# Patient Record
Sex: Female | Born: 1975 | Race: Black or African American | Hispanic: No | Marital: Married | Smoking: Never smoker
Health system: Southern US, Community
[De-identification: ages and names within clinical notes are randomized; demographics above are authoritative.]

## PROBLEM LIST (undated history)

## (undated) DIAGNOSIS — R030 Elevated blood-pressure reading, without diagnosis of hypertension: Secondary | ICD-10-CM

## (undated) DIAGNOSIS — J45909 Unspecified asthma, uncomplicated: Secondary | ICD-10-CM

## (undated) DIAGNOSIS — B373 Candidiasis of vulva and vagina: Secondary | ICD-10-CM

## (undated) DIAGNOSIS — D649 Anemia, unspecified: Secondary | ICD-10-CM

## (undated) DIAGNOSIS — R87629 Unspecified abnormal cytological findings in specimens from vagina: Secondary | ICD-10-CM

## (undated) DIAGNOSIS — Z8759 Personal history of other complications of pregnancy, childbirth and the puerperium: Secondary | ICD-10-CM

## (undated) DIAGNOSIS — Z8632 Personal history of gestational diabetes: Secondary | ICD-10-CM

## (undated) DIAGNOSIS — Z8719 Personal history of other diseases of the digestive system: Secondary | ICD-10-CM

## (undated) DIAGNOSIS — N6310 Unspecified lump in the right breast, unspecified quadrant: Secondary | ICD-10-CM

## (undated) DIAGNOSIS — B9689 Other specified bacterial agents as the cause of diseases classified elsewhere: Secondary | ICD-10-CM

## (undated) DIAGNOSIS — Z8742 Personal history of other diseases of the female genital tract: Secondary | ICD-10-CM

## (undated) DIAGNOSIS — T7840XA Allergy, unspecified, initial encounter: Secondary | ICD-10-CM

## (undated) DIAGNOSIS — O24419 Gestational diabetes mellitus in pregnancy, unspecified control: Secondary | ICD-10-CM

## (undated) DIAGNOSIS — IMO0002 Reserved for concepts with insufficient information to code with codable children: Secondary | ICD-10-CM

## (undated) DIAGNOSIS — D573 Sickle-cell trait: Secondary | ICD-10-CM

## (undated) DIAGNOSIS — N83209 Unspecified ovarian cyst, unspecified side: Secondary | ICD-10-CM

## (undated) DIAGNOSIS — E119 Type 2 diabetes mellitus without complications: Secondary | ICD-10-CM

## (undated) DIAGNOSIS — N760 Acute vaginitis: Secondary | ICD-10-CM

## (undated) DIAGNOSIS — D219 Benign neoplasm of connective and other soft tissue, unspecified: Secondary | ICD-10-CM

## (undated) DIAGNOSIS — B999 Unspecified infectious disease: Secondary | ICD-10-CM

## (undated) DIAGNOSIS — O209 Hemorrhage in early pregnancy, unspecified: Secondary | ICD-10-CM

## (undated) DIAGNOSIS — Z9889 Other specified postprocedural states: Secondary | ICD-10-CM

## (undated) DIAGNOSIS — R112 Nausea with vomiting, unspecified: Secondary | ICD-10-CM

## (undated) DIAGNOSIS — I10 Essential (primary) hypertension: Secondary | ICD-10-CM

## (undated) HISTORY — DX: Personal history of gestational diabetes: Z86.32

## (undated) HISTORY — DX: Allergy, unspecified, initial encounter: T78.40XA

## (undated) HISTORY — DX: Unspecified asthma, uncomplicated: J45.909

## (undated) HISTORY — DX: Hemorrhage in early pregnancy, unspecified: O20.9

## (undated) HISTORY — DX: Other specified bacterial agents as the cause of diseases classified elsewhere: B96.89

## (undated) HISTORY — DX: Benign neoplasm of connective and other soft tissue, unspecified: D21.9

## (undated) HISTORY — PX: LAPAROTOMY: SHX154

## (undated) HISTORY — DX: Personal history of other complications of pregnancy, childbirth and the puerperium: Z87.59

## (undated) HISTORY — DX: Acute vaginitis: N76.0

## (undated) HISTORY — PX: LEEP: SHX91

## (undated) HISTORY — DX: Personal history of other diseases of the digestive system: Z87.19

## (undated) HISTORY — DX: Sickle-cell trait: D57.3

## (undated) HISTORY — DX: Personal history of other diseases of the female genital tract: Z87.42

## (undated) HISTORY — DX: Reserved for concepts with insufficient information to code with codable children: IMO0002

## (undated) HISTORY — DX: Unspecified lump in the right breast, unspecified quadrant: N63.10

## (undated) HISTORY — DX: Elevated blood-pressure reading, without diagnosis of hypertension: R03.0

## (undated) HISTORY — PX: WISDOM TOOTH EXTRACTION: SHX21

## (undated) HISTORY — DX: Candidiasis of vulva and vagina: B37.3

---

## 1997-07-06 DIAGNOSIS — R87619 Unspecified abnormal cytological findings in specimens from cervix uteri: Secondary | ICD-10-CM

## 1997-07-06 DIAGNOSIS — IMO0002 Reserved for concepts with insufficient information to code with codable children: Secondary | ICD-10-CM

## 1997-07-06 HISTORY — DX: Unspecified abnormal cytological findings in specimens from cervix uteri: R87.619

## 1997-07-06 HISTORY — DX: Reserved for concepts with insufficient information to code with codable children: IMO0002

## 1997-11-05 ENCOUNTER — Other Ambulatory Visit: Admission: RE | Admit: 1997-11-05 | Discharge: 1997-11-05 | Payer: Self-pay | Admitting: Gynecology

## 1999-09-18 ENCOUNTER — Other Ambulatory Visit: Admission: RE | Admit: 1999-09-18 | Discharge: 1999-09-18 | Payer: Self-pay | Admitting: Gynecology

## 1999-12-07 ENCOUNTER — Inpatient Hospital Stay (HOSPITAL_COMMUNITY): Admission: AD | Admit: 1999-12-07 | Discharge: 1999-12-08 | Payer: Self-pay | Admitting: Gynecology

## 1999-12-07 ENCOUNTER — Encounter: Payer: Self-pay | Admitting: Gynecology

## 2000-01-23 ENCOUNTER — Ambulatory Visit (HOSPITAL_COMMUNITY): Admission: RE | Admit: 2000-01-23 | Discharge: 2000-01-23 | Payer: Self-pay | Admitting: Gynecology

## 2000-01-23 ENCOUNTER — Encounter: Payer: Self-pay | Admitting: Gynecology

## 2000-09-27 ENCOUNTER — Other Ambulatory Visit: Admission: RE | Admit: 2000-09-27 | Discharge: 2000-09-27 | Payer: Self-pay | Admitting: Gynecology

## 2001-07-06 DIAGNOSIS — E119 Type 2 diabetes mellitus without complications: Secondary | ICD-10-CM

## 2001-07-06 HISTORY — DX: Type 2 diabetes mellitus without complications: E11.9

## 2001-09-27 ENCOUNTER — Other Ambulatory Visit: Admission: RE | Admit: 2001-09-27 | Discharge: 2001-09-27 | Payer: Self-pay | Admitting: Gynecology

## 2002-03-24 ENCOUNTER — Encounter: Admission: RE | Admit: 2002-03-24 | Discharge: 2002-04-18 | Payer: Self-pay | Admitting: *Deleted

## 2002-05-30 ENCOUNTER — Inpatient Hospital Stay (HOSPITAL_COMMUNITY): Admission: AD | Admit: 2002-05-30 | Discharge: 2002-06-01 | Payer: Self-pay | Admitting: Gynecology

## 2002-07-11 ENCOUNTER — Other Ambulatory Visit: Admission: RE | Admit: 2002-07-11 | Discharge: 2002-07-11 | Payer: Self-pay | Admitting: Gynecology

## 2003-07-07 DIAGNOSIS — B373 Candidiasis of vulva and vagina: Secondary | ICD-10-CM

## 2003-07-07 DIAGNOSIS — B3731 Acute candidiasis of vulva and vagina: Secondary | ICD-10-CM

## 2003-07-07 HISTORY — DX: Acute candidiasis of vulva and vagina: B37.31

## 2003-07-07 HISTORY — DX: Candidiasis of vulva and vagina: B37.3

## 2003-07-12 ENCOUNTER — Other Ambulatory Visit: Admission: RE | Admit: 2003-07-12 | Discharge: 2003-07-12 | Payer: Self-pay | Admitting: Obstetrics and Gynecology

## 2004-07-06 DIAGNOSIS — O209 Hemorrhage in early pregnancy, unspecified: Secondary | ICD-10-CM

## 2004-07-06 DIAGNOSIS — B9689 Other specified bacterial agents as the cause of diseases classified elsewhere: Secondary | ICD-10-CM

## 2004-07-06 DIAGNOSIS — N6310 Unspecified lump in the right breast, unspecified quadrant: Secondary | ICD-10-CM

## 2004-07-06 HISTORY — DX: Unspecified lump in the right breast, unspecified quadrant: N63.10

## 2004-07-06 HISTORY — DX: Hemorrhage in early pregnancy, unspecified: O20.9

## 2004-07-06 HISTORY — DX: Other specified bacterial agents as the cause of diseases classified elsewhere: B96.89

## 2004-07-21 ENCOUNTER — Other Ambulatory Visit: Admission: RE | Admit: 2004-07-21 | Discharge: 2004-07-21 | Payer: Self-pay | Admitting: Obstetrics and Gynecology

## 2004-07-24 ENCOUNTER — Encounter: Admission: RE | Admit: 2004-07-24 | Discharge: 2004-07-24 | Payer: Self-pay | Admitting: Obstetrics and Gynecology

## 2004-09-09 ENCOUNTER — Inpatient Hospital Stay (HOSPITAL_COMMUNITY): Admission: AD | Admit: 2004-09-09 | Discharge: 2004-09-09 | Payer: Self-pay | Admitting: Obstetrics and Gynecology

## 2004-09-12 ENCOUNTER — Inpatient Hospital Stay (HOSPITAL_COMMUNITY): Admission: AD | Admit: 2004-09-12 | Discharge: 2004-09-12 | Payer: Self-pay | Admitting: Obstetrics and Gynecology

## 2004-09-15 ENCOUNTER — Inpatient Hospital Stay (HOSPITAL_COMMUNITY): Admission: AD | Admit: 2004-09-15 | Discharge: 2004-09-15 | Payer: Self-pay | Admitting: Obstetrics and Gynecology

## 2004-09-22 ENCOUNTER — Inpatient Hospital Stay (HOSPITAL_COMMUNITY): Admission: AD | Admit: 2004-09-22 | Discharge: 2004-09-22 | Payer: Self-pay | Admitting: Obstetrics and Gynecology

## 2004-09-24 ENCOUNTER — Inpatient Hospital Stay (HOSPITAL_COMMUNITY): Admission: AD | Admit: 2004-09-24 | Discharge: 2004-09-26 | Payer: Self-pay | Admitting: Obstetrics and Gynecology

## 2004-09-24 DIAGNOSIS — Z8759 Personal history of other complications of pregnancy, childbirth and the puerperium: Secondary | ICD-10-CM

## 2004-09-24 HISTORY — DX: Personal history of other complications of pregnancy, childbirth and the puerperium: Z87.59

## 2005-08-24 ENCOUNTER — Other Ambulatory Visit: Admission: RE | Admit: 2005-08-24 | Discharge: 2005-08-24 | Payer: Self-pay | Admitting: Obstetrics and Gynecology

## 2005-10-22 ENCOUNTER — Ambulatory Visit (HOSPITAL_COMMUNITY): Admission: RE | Admit: 2005-10-22 | Discharge: 2005-10-22 | Payer: Self-pay | Admitting: Obstetrics and Gynecology

## 2006-07-06 DIAGNOSIS — IMO0001 Reserved for inherently not codable concepts without codable children: Secondary | ICD-10-CM

## 2006-07-06 HISTORY — DX: Reserved for inherently not codable concepts without codable children: IMO0001

## 2007-07-07 DIAGNOSIS — Z8719 Personal history of other diseases of the digestive system: Secondary | ICD-10-CM

## 2007-07-07 HISTORY — DX: Personal history of other diseases of the digestive system: Z87.19

## 2011-03-26 DIAGNOSIS — Z8742 Personal history of other diseases of the female genital tract: Secondary | ICD-10-CM

## 2011-03-26 HISTORY — DX: Personal history of other diseases of the female genital tract: Z87.42

## 2011-04-15 DIAGNOSIS — D219 Benign neoplasm of connective and other soft tissue, unspecified: Secondary | ICD-10-CM

## 2011-04-15 HISTORY — DX: Benign neoplasm of connective and other soft tissue, unspecified: D21.9

## 2012-01-05 ENCOUNTER — Telehealth: Payer: Self-pay | Admitting: Obstetrics and Gynecology

## 2012-01-05 NOTE — Telephone Encounter (Signed)
Spoke with pt rgd msg pt c/o cramping with cycle no relief with advil advised pt can take ibuprofen for cramps no heavy bleeding pt only have to wear panty liner advised pt first available appt 01/08/12 pt will keep appt

## 2012-01-08 ENCOUNTER — Encounter: Payer: Self-pay | Admitting: Obstetrics and Gynecology

## 2012-06-16 ENCOUNTER — Encounter: Payer: Self-pay | Admitting: Obstetrics and Gynecology

## 2012-06-16 ENCOUNTER — Ambulatory Visit (INDEPENDENT_AMBULATORY_CARE_PROVIDER_SITE_OTHER): Payer: Managed Care, Other (non HMO) | Admitting: Obstetrics and Gynecology

## 2012-06-16 VITALS — BP 118/78 | Ht 61.0 in | Wt 182.0 lb

## 2012-06-16 DIAGNOSIS — D219 Benign neoplasm of connective and other soft tissue, unspecified: Secondary | ICD-10-CM

## 2012-06-16 DIAGNOSIS — D259 Leiomyoma of uterus, unspecified: Secondary | ICD-10-CM

## 2012-06-16 DIAGNOSIS — Z01419 Encounter for gynecological examination (general) (routine) without abnormal findings: Secondary | ICD-10-CM

## 2012-06-16 LAB — CBC
HCT: 33.2 % — ABNORMAL LOW (ref 36.0–46.0)
Hemoglobin: 10.9 g/dL — ABNORMAL LOW (ref 12.0–15.0)
RBC: 4.05 MIL/uL (ref 3.87–5.11)
WBC: 6.6 10*3/uL (ref 4.0–10.5)

## 2012-06-16 MED ORDER — IBUPROFEN 600 MG PO TABS: 600.0000 mg | ORAL_TABLET | Freq: Four times a day (QID) | ORAL | Status: DC | PRN

## 2012-06-16 NOTE — Progress Notes (Signed)
Last Pap: 03/2011 WNL: Yes Regular Periods:yes Contraception: none  Monthly Breast exam:no Tetanus<55yrs:yes Nl.Bladder Function:yes Daily BMs:no Healthy Diet:yes Calcium:yes Mammogram:no Exercise:yes Have often Exercise: 3-5 times a week  Seatbelt: yes Abuse at home: no Stressful work:yes PCP: Dr.Gibson  Change in PMH: unchanged  Change in ZOX:[id number]  Pt stated no issues today.  BP 118/78  Ht 5\' 1"  (1.549 m)  Wt 182 lb (82.555 kg)  BMI 34.39 kg/m2  LMP 05/31/2012  Pt with complaints:yes.  Pt has stared she has pain from fibroids and heavy bleeding.  She bleeds for 7-9 days and has throbbing or an ache.  She also has pain with IC.  No SOB or CP Physical Examination: General appearance - alert, well appearing, and in no distress Mental status - normal mood, behavior, speech, dress, motor activity, and thought processes Neck - supple, no significant adenopathy,  thyroid exam: thyroid is normal in size without nodules or tenderness Chest - clear to auscultation, no wheezes, rales or rhonchi, symmetric air entry Heart - normal rate and regular rhythm Abdomen - soft, nontender, nondistended, no masses or organomegaly Breasts - breasts appear normal, no suspicious masses, no skin or nipple changes or axillary nodes Pelvic - normal external genitalia, vulva, vagina, cervix,  and adnexa.  Uterus is irregular and about 14 weeks size Rectal - normal rectal, no masses, rectal exam not indicated Back exam - full range of motion, no tenderness, palpable spasm or pain on motion Neurological - alert, oriented, normal speech, no focal findings or movement disorder noted Musculoskeletal - no joint tenderness, deformity or swelling Extremities - no edema, redness or tenderness in the calves or thighs Skin - normal coloration and turgor, no rashes, no suspicious skin lesions noted Routine exam Pap sent yes Mammogram due no nothing used for contraception RT 4-6 weeks for US/poss EBX Her  embx and TSH and PRL was normal last yr. Will check a cbc today

## 2012-06-16 NOTE — Patient Instructions (Signed)
Fibroids Fibroids are lumps (tumors) that can occur any place in a woman's body. These lumps are not cancerous. Fibroids vary in size, weight, and where they grow. HOME CARE  Do not take aspirin.  Write down the number of pads or tampons you use during your period. Tell your doctor. This can help determine the best treatment for you. GET HELP RIGHT AWAY IF:  You have pain in your lower belly (abdomen) that is not helped with medicine.  You have cramps that are not helped with medicine.  You have more bleeding between or during your period.  You feel lightheaded or pass out (faint).  Your lower belly pain gets worse. MAKE SURE YOU:  Understand these instructions.  Will watch your condition.  Will get help right away if you are not doing well or get worse. Document Released: 07/25/2010 Document Revised: 09/14/2011 Document Reviewed: 07/25/2010 Exit[employer] Patient Information 2013 [employer].  

## 2012-06-16 NOTE — Addendum Note (Signed)
Addended by: Loralyn Freshwater on: 06/16/2012 11:18 AM   Modules accepted: Orders

## 2012-06-20 LAB — PAP IG W/ RFLX HPV ASCU

## 2012-06-22 ENCOUNTER — Telehealth: Payer: Self-pay

## 2012-06-22 NOTE — Telephone Encounter (Signed)
Lm on vm for pt to call back.

## 2012-06-24 ENCOUNTER — Telehealth: Payer: Self-pay

## 2012-06-24 NOTE — Telephone Encounter (Signed)
Lm on vm for pt to call back.

## 2012-08-01 ENCOUNTER — Encounter: Payer: Managed Care, Other (non HMO) | Admitting: Obstetrics and Gynecology

## 2012-08-01 ENCOUNTER — Other Ambulatory Visit: Payer: Managed Care, Other (non HMO)

## 2012-08-10 ENCOUNTER — Encounter: Payer: Self-pay | Admitting: Obstetrics and Gynecology

## 2012-08-10 ENCOUNTER — Other Ambulatory Visit: Payer: Managed Care, Other (non HMO)

## 2012-08-10 ENCOUNTER — Ambulatory Visit: Payer: Managed Care, Other (non HMO) | Admitting: Obstetrics and Gynecology

## 2012-08-10 ENCOUNTER — Ambulatory Visit: Payer: Managed Care, Other (non HMO)

## 2012-08-10 ENCOUNTER — Other Ambulatory Visit: Payer: Self-pay | Admitting: Obstetrics and Gynecology

## 2012-08-10 VITALS — BP 120/80 | Wt 182.0 lb

## 2012-08-10 DIAGNOSIS — D219 Benign neoplasm of connective and other soft tissue, unspecified: Secondary | ICD-10-CM

## 2012-08-10 NOTE — Progress Notes (Addendum)
F/u u/s BP 120/80  Wt 182 lb (82.555 kg)  LMP 07/23/2012 [place] sig for posterior fibroid.  It is 8 cm and has increased in size.   All treatments reviewed with the pt.  She desires robotic myomectomy.   Will have consult with DR RIVARD  CONSULTATION NOTE  15 minutes face-to-face encounter to review robotic myomectomy. Records reviewed. Single posterior fibroid 8.5 cm.  Exam: Fundus at umbilicus with 15 cm to xyphoid. Patient is a candidate for robotic approach.  The following was reviewed with patient:    Benign nature of fibroids as well as unpredictable growth during reproductive years.      Possible fibroid symptoms including: menorrhagia, dysmenorrhea, urinary frequency,  pelvic pain, and back pain.     Expected resolution/improvement of symptoms with menopausal state.    Treatment options: expectant management, NSAIDS, oral contraceptives,       Depo Provera, Uterine Artery Embolization, myomectomy, and hysterectomy.  Surgical approach options to myomectomy were reviewed including, laparoscopic, robotically assisted and abdominal. Risks, benefits and limitations discussed including but not limited to impossibility to complete surgery through robotic approach, not removing all fibroids due to absent tactile feedback, possibility of existing adhesions ( 1 cesarean section and 1 ectopic pregnancy), risk of cavity entry with requiring repeat cesarean birth and risk of post-op adhesions including tubal occlusion.   The patient will call back with her decision  Thank you for the consultation,  Silverio Lay MD

## 2012-08-11 ENCOUNTER — Telehealth: Payer: Self-pay | Admitting: Obstetrics and Gynecology

## 2012-08-15 ENCOUNTER — Other Ambulatory Visit: Payer: Self-pay | Admitting: Obstetrics and Gynecology

## 2012-08-15 ENCOUNTER — Telehealth: Payer: Self-pay | Admitting: Obstetrics and Gynecology

## 2012-08-17 ENCOUNTER — Telehealth: Payer: Self-pay | Admitting: Obstetrics and Gynecology

## 2012-08-17 NOTE — Telephone Encounter (Signed)
Robotic Myomectomy scheduled for 09/07/12@ 12:30 with SR/EP. [employer] Effective 07/06/12. Plan pays 80/20 after a $1,500 deductible. Pre-op due $364.20. -Adrianne Pridgen

## 2012-08-26 ENCOUNTER — Encounter (HOSPITAL_COMMUNITY): Payer: Self-pay | Admitting: Pharmacist

## 2012-08-29 ENCOUNTER — Encounter: Payer: Self-pay | Admitting: Obstetrics and Gynecology

## 2012-08-29 DIAGNOSIS — D219 Benign neoplasm of connective and other soft tissue, unspecified: Secondary | ICD-10-CM | POA: Insufficient documentation

## 2012-08-29 DIAGNOSIS — I10 Essential (primary) hypertension: Secondary | ICD-10-CM | POA: Insufficient documentation

## 2012-08-29 DIAGNOSIS — Z9079 Acquired absence of other genital organ(s): Secondary | ICD-10-CM | POA: Insufficient documentation

## 2012-08-29 DIAGNOSIS — J45909 Unspecified asthma, uncomplicated: Secondary | ICD-10-CM | POA: Insufficient documentation

## 2012-08-30 ENCOUNTER — Encounter: Payer: Self-pay | Admitting: Obstetrics and Gynecology

## 2012-08-30 ENCOUNTER — Ambulatory Visit: Payer: Managed Care, Other (non HMO) | Admitting: Obstetrics and Gynecology

## 2012-08-30 VITALS — BP 116/80 | HR 82 | Temp 98.2°F | Resp 14 | Ht 61.0 in | Wt 184.0 lb

## 2012-08-30 DIAGNOSIS — Z01818 Encounter for other preprocedural examination: Secondary | ICD-10-CM

## 2012-08-30 DIAGNOSIS — N920 Excessive and frequent menstruation with regular cycle: Secondary | ICD-10-CM

## 2012-08-30 DIAGNOSIS — N946 Dysmenorrhea, unspecified: Secondary | ICD-10-CM

## 2012-08-30 DIAGNOSIS — D219 Benign neoplasm of connective and other soft tissue, unspecified: Secondary | ICD-10-CM

## 2012-08-30 NOTE — Progress Notes (Signed)
Briana Ford is a 37 y.o. female G3P0011 who presents for a robot assisted myomectomy because of symptomatic fibroids, menorrhagia, dysmenorrhea and anemia.  For over four years the patient reports increasing volume and duration of menses.  Currently she will flow for 7 days with the need to change a tampon plus two super pads every two hours.  She may spot for a few days after her period stops as well as after intercourse.  She admits to a throbbing heaviness, as opposed to cramping,  that is relieved with Ibuprofen 800 mg along with positional dyspareunia and constipation.  She denies urinary tract symptoms, vaginitis symptoms, fatigue or dizziness.  In 2012 she had a normal TSH, prolactin and endometrial biopsy but in 06/2012 her H/H = 10.9/33.2 .  The patient is on iron supplementation.  A pelvic ultrasound 06/2012 showed a uterus-10.4 x 8.38 x 8.53 cm with a posterior, left intramural fibroid measuring 8.2 x 6.7 x 8.4 cm; right ovary-3.46 x 2.42 x 1.39 cm and left ovary-3.70 x 2.56 x 1.84 cm.  A review of both medical and surgical management options were reviewed with the patient and she has decided to proceed with a myomectomy.   Past Medical History  OB History: G3P0011 Ectopic 2009,  Fetal Demise due to PTL and C-section 2003  GYN History: menarche: 37 YO; LMP: 08/18/12;    Contracepton no method  The patient reports a past history of: herpes.  Remote history of abnormal PAP smear treated with LEEP in 1998 but they have been normal since;  Last PAP smear: 2013  Medical History: Hypertension, asthma, hemorrhoids and fibroids  Surgical History:  1998 Loop Electrosurgical Excision Procedure, 2006 Laparoscopic Right Salpingectomy for Ectopic, 2009 Uterine Polypectomy Denies history of blood transfusions but has severe nausea and vomiting with anesthesia.  Family History: Diabetes, cardiovascular disease, thyroid disease, depression and hypertension  Social History: Married and employed  [employer]; Denies alcohol, tobacco or illicit drug use   Outpatient Encounter Prescriptions as of 08/30/2012  Medication Sig Dispense Refill  . albuterol (PROVENTIL HFA;VENTOLIN HFA) 108 (90 BASE) MCG/ACT inhaler Inhale 2 puffs into the lungs every 6 (six) hours as needed for wheezing.      . budesonide-formoterol (SYMBICORT) 160-4.5 MCG/ACT inhaler Inhale 2 puffs into the lungs 2 (two) times daily.      Marland Kitchen desloratadine (CLARINEX) 5 MG tablet Take 5 mg by mouth daily.      Marland Kitchen ibuprofen (ADVIL,MOTRIN) 600 MG tablet Take 1 tablet (600 mg total) by mouth every 6 (six) hours as needed for pain.  30 tablet  0  . montelukast (SINGULAIR) 10 MG tablet Take 10 mg by mouth at bedtime.      Marland Kitchen NIFEdipine (PROCARDIA-XL/ADALAT-CC/NIFEDICAL-XL) 30 MG 24 hr tablet Take 30 mg by mouth daily.      . polycarbophil (FIBERCON) 625 MG tablet Take 625 mg by mouth daily.      Marland Kitchen zolpidem (AMBIEN) 10 MG tablet Take 5-10 mg by mouth at bedtime as needed for sleep.       No facility-administered encounter medications on file as of 08/30/2012.    No Known Allergies  Denies sensitivity to peanuts, shellfish, soy, latex or adhesives.   ROS: Admits to glasses but denies headache, vision changes, nasal congestion, dysphagia, tinnitus, dizziness, hoarseness, cough,  chest pain, shortness of breath, nausea, vomiting, diarrhea, urinary frequency, urgency  dysuria, hematuria, vaginitis symptoms, swelling of joints, easy bruising,  myalgias, arthralgias, skin rashes, unexplained weight loss and except  as is mentioned in the history of present illness, patient's review of systems is otherwise negative.   Physical Exam    BP 116/80  Pulse 82  Temp(Src) 98.2 F (36.8 C) (Oral)  Resp 14  Ht 5\' 1"  (1.549 m)  Wt 184 lb (83.462 kg)  BMI 34.78 kg/m2  LMP 08/18/2012  Neck: supple without masses or thyromegaly Lungs: clear to auscultation Heart: regular rate and rhythm Abdomen: soft, tender mass  arising from pelvis-3 fingers above symphysis pubis and no organomegaly Pelvic:EGBUS- wnl; vagina-normal rugae; uterus-14 weeks size, minimally mobile,  cervix posterior, without lesions or motion tenderness; adnexae-no tenderness or masses Extremities:  no clubbing, cyanosis or edema   Assesment:  Symptomatic Uterine Fibroids                        Menorrhagia                        Dysmenorrhea                        Anemia   Disposition:  Disposition:  A discussion was held with patient regarding the indication for her procedure(s) along with the risks, which include but are not limited to: reaction to anesthesia, damage to adjacent organs, infection and excessive bleeding,  possible need for a C-section should she become pregnant and possible need for an open abdominal incision to safely complete the surgery. .  Patient also understands that the robotic approach to her surgery requires more time than that of an open abdominal incision, that she will experience transient post operative facial edema, that her hospital stay is expected to be 0-2 days and return to regular activities in 2-3 weeks (with the exception of intercourse which will be 6 weeks).  The patient was given the Miralax bowel prep to be completed 24 hours prior to her surgery.  The patient verbalized understanding of these risks and pre-operative instructions and has consented to proceed with a Robot Assisted Myomectomy at [facility] on March 5 , 2014 at 12:30 pm.   CSN# [id number]   Acel Natzke J. Lowell Guitar, PA-C  for Dr. Crist Fat. Rivard

## 2012-08-30 NOTE — Progress Notes (Deleted)
Briana Ford is a 37 y.o. female G3P0011 who presents for a robot assisted myomectomy because of symptomatic fibroids, menorrhagia, dysmenorrhea and anemia.  For over four years the patient reports increasing volume and duration of menses.  Currently she will flow for 7 days with the need to change a tampon plus two super pads every two hours.  She may spot for a few days after her period stops as well as after intercourse.  She admits to a throbbing heaviness, as opposed to cramping,  that is relieved with Ibuprofen 800 mg along with positional dyspareunia and constipation.  She denies urinary tract symptoms, vaginitis symptoms, fatigue or dizziness.  In 2012 had a normal TSH, prolactin and endometrial biopsy but in 06/2012 her H/H = 10.9/33.2 .  The patient is on iron supplementation.  A pelvic ultrasound 06/2012 showed a uterus-10.4 x 8.38 x 8.53 cm with a posterior, left intramural fibroid measuring 8.2 x 6.7 x 8.4 cm; right ovary-3.46 x 2.42 x 1.39 cm and left ovary-3.70 x 2.56 x 1.84 cm.  A review of both medical and surgical management options were reviewed with the patient and she has decided to proceed with a myomectomy.  Past Medical History  OB History: G3P1-1-1-1 C-section 2003,  Fetal Demise @ 21 weeks and SAB;  History of Gestatonal Diabetes and Infertility  GYN History: menarche    LMP    Contracepton no method  The patient reports a past history of: herpes.  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 albuterol (PROVENTIL HFA;VENTOLIN HFA) 108 (90 BASE) MCG/ACT inhaler Inhale 2 puffs into the lungs every 6 (six) hours as  needed for wheezing.      . budesonide-formoterol (SYMBICORT) 160-4.5 MCG/ACT inhaler Inhale 2 puffs into the lungs 2 (two) times daily.      Marland Kitchen desloratadine (CLARINEX) 5 MG tablet Take 5 mg by mouth daily.      Marland Kitchen ibuprofen (ADVIL,MOTRIN) 600 MG tablet Take 1 tablet (600 mg total) by mouth every 6 (six) hours as needed for pain.  30 tablet  0  . montelukast (SINGULAIR) 10 MG tablet Take 10 mg by mouth at bedtime.      Marland Kitchen NIFEdipine (PROCARDIA-XL/ADALAT-CC/NIFEDICAL-XL) 30 MG 24 hr tablet Take 30 mg by mouth daily.      . polycarbophil (FIBERCON) 625 MG tablet Take 625 mg by mouth daily.      Marland Kitchen zolpidem (AMBIEN) 10 MG tablet Take 5-10 mg by mouth at bedtime as needed for sleep.       No facility-administered encounter medications on file as of 08/30/2012.    No Known Allergies  Denies sensitivity to peanuts, shellfish, soy, latex or adhesives.   ROS: Admits to glasses but denies headache, vision changes, nasal congestion, dysphagia, tinnitus, dizziness, hoarseness, cough,  chest pain, shortness of breath, nausea, vomiting, diarrhea, urinary frequency, urgency,  dysuria, hematuria, vaginitis symptoms, swelling of joints,easy bruising,  myalgias, arthralgias, skin rashes, unexplained weight loss and except as is mentioned in the history of present illness, patient's review of systems is otherwise negative.  Physical Exam    BP 116/80  Pulse 82  Temp(Src) 98.2 F (36.8  C) (Oral)  Resp 14  Ht 5\' 1"  (1.549 m)  Wt 184 lb (83.462 kg)  BMI 34.78 kg/m2  LMP 08/18/2012  Neck: supple without masses or thyromegaly Lungs: clear to auscultation Heart: regular rate and rhythm Abdomen: soft, tender mass arising from pelvis-3 fingers above symphysis pubis and no organomegaly Pelvic:EGBUS- wnl; vagina-normal rugae; uterus-14 weeks size, irregular and slightly mobile, cervix-posterior without lesions or motion tenderness; adnexae-no tenderness or masses Extremities:  no clubbing, cyanosis or  edema   Assesment:  Symptomatic Uterine Fibroids                        Menorrhagia                        Dysmenorrhea                        Anemia   Disposition: Disposition:  A discussion was held with patient regarding the indication for her procedure(s) along with the risks, which include but are not limited to: reaction to anesthesia, damage to adjacent organs, infection and excessive bleeding and possible need for an open abdominal incision.  Patient also understands that the robotic approach to her surgery requires more time than that of an open abdominal incision, that she will experience transient post operative facial edema, that her hospital stay is expected to be 0-2 days and return to regular activities in 2-3 weeks (with the exception of intercourse which will be 6 weeks).  The patient was given the Miralax bowel prep to be completed 24 hours prior to her surgery.  The patient verbalized understanding of these risks and pre-operative instructions and has consented to proceed with a Robot Assisted Laparoscopic Myomectomy at [facility] on September 07, 2012 at 12:30 p.m.   CSN# [id number]   Rodolph Hagemann J. Lowell Guitar, PA-C  for Dr. Crist Fat. Rivard

## 2012-08-30 NOTE — H&P (Signed)
Briana Ford is a 36 y.o. female G3P0011 who presents for a robot assisted myomectomy because of symptomatic fibroids, menorrhagia, dysmenorrhea and anemia.  For over four years the patient reports increasing volume and duration of menses.  Currently she will flow for 7 days with the need to change a tampon plus two super pads every two hours.  She may spot for a few days after her period stops as well as after intercourse.  She admits to a throbbing heaviness, as opposed to cramping,  that is relieved with Ibuprofen 800 mg along with positional dyspareunia and constipation.  She denies urinary tract symptoms, vaginitis symptoms, fatigue or dizziness.  In 2012 she had a normal TSH, prolactin and endometrial biopsy but in 06/2012 her H/H = 10.9/33.2 .  The patient is on iron supplementation.  A pelvic ultrasound 06/2012 showed a uterus-10.4 x 8.38 x 8.53 cm with a posterior, left intramural fibroid measuring 8.2 x 6.7 x 8.4 cm; right ovary-3.46 x 2.42 x 1.39 cm and left ovary-3.70 x 2.56 x 1.84 cm.  A review of both medical and surgical management options were reviewed with the patient and she has decided to proceed with a myomectomy.   Past Medical History  OB History: G3P0011 Ectopic 2009,  Fetal Demise due to PTL and C-section 2003  GYN History: menarche: 37 YO; LMP: 08/18/12;    Contracepton no method  The patient reports a past history of: herpes.  Remote history of abnormal PAP smear treated with LEEP in 1998 but they have been normal since;  Last PAP smear: 2013  Medical History: Hypertension, asthma, hemorrhoids and fibroids  Surgical History:  1998 Loop Electrosurgical Excision Procedure, 2006 Laparoscopic Right Salpingectomy for Ectopic, 2009 Uterine Polypectomy Denies history of blood transfusions but has severe nausea and vomiting with anesthesia.  Family History: Diabetes, cardiovascular disease, thyroid disease, depression and hypertension  Social History: Married and employed  [employer]; Denies alcohol, tobacco or illicit drug use   Outpatient Encounter Prescriptions as of 08/30/2012  Medication Sig Dispense Refill  . albuterol (PROVENTIL HFA;VENTOLIN HFA) 108 (90 BASE) MCG/ACT inhaler Inhale 2 puffs into the lungs every 6 (six) hours as needed for wheezing.      . budesonide-formoterol (SYMBICORT) 160-4.5 MCG/ACT inhaler Inhale 2 puffs into the lungs 2 (two) times daily.      . desloratadine (CLARINEX) 5 MG tablet Take 5 mg by mouth daily.      . ibuprofen (ADVIL,MOTRIN) 600 MG tablet Take 1 tablet (600 mg total) by mouth every 6 (six) hours as needed for pain.  30 tablet  0  . montelukast (SINGULAIR) 10 MG tablet Take 10 mg by mouth at bedtime.      . NIFEdipine (PROCARDIA-XL/ADALAT-CC/NIFEDICAL-XL) 30 MG 24 hr tablet Take 30 mg by mouth daily.      . polycarbophil (FIBERCON) 625 MG tablet Take 625 mg by mouth daily.      . zolpidem (AMBIEN) 10 MG tablet Take 5-10 mg by mouth at bedtime as needed for sleep.       No facility-administered encounter medications on file as of 08/30/2012.    No Known Allergies  Denies sensitivity to peanuts, shellfish, soy, latex or adhesives.   ROS: Admits to glasses but denies headache, vision changes, nasal congestion, dysphagia, tinnitus, dizziness, hoarseness, cough,  chest pain, shortness of breath, nausea, vomiting, diarrhea, urinary frequency, urgency  dysuria, hematuria, vaginitis symptoms, swelling of joints, easy bruising,  myalgias, arthralgias, skin rashes, unexplained weight loss and except   as is mentioned in the history of present illness, patient's review of systems is otherwise negative.   Physical Exam    BP 116/80  Pulse 82  Temp(Src) 98.2 F (36.8 C) (Oral)  Resp 14  Ht 5' 1" (1.549 m)  Wt 184 lb (83.462 kg)  BMI 34.78 kg/m2  LMP 08/18/2012  Neck: supple without masses or thyromegaly Lungs: clear to auscultation Heart: regular rate and rhythm Abdomen: soft, tender mass  arising from pelvis-3 fingers above symphysis pubis and no organomegaly Pelvic:EGBUS- wnl; vagina-normal rugae; uterus-14 weeks size, minimally mobile,  cervix posterior, without lesions or motion tenderness; adnexae-no tenderness or masses Extremities:  no clubbing, cyanosis or edema   Assesment:  Symptomatic Uterine Fibroids                        Menorrhagia                        Dysmenorrhea                        Anemia   Disposition:  Disposition:  A discussion was held with patient regarding the indication for her procedure(s) along with the risks, which include but are not limited to: reaction to anesthesia, damage to adjacent organs, infection and excessive bleeding,  possible need for a C-section should she become pregnant and possible need for an open abdominal incision to safely complete the surgery. .  Patient also understands that the robotic approach to her surgery requires more time than that of an open abdominal incision, that she will experience transient post operative facial edema, that her hospital stay is expected to be 0-2 days and return to regular activities in 2-3 weeks (with the exception of intercourse which will be 6 weeks).  The patient was given the Miralax bowel prep to be completed 24 hours prior to her surgery.  The patient verbalized understanding of these risks and pre-operative instructions and has consented to proceed with a Robot Assisted Myomectomy at [facility] on March 5 , 2014 at 12:30 pm.   CSN# [id number]   Zairah Arista J. Arora Coakley, PA-C  for Dr. Sandra A. Rivard   

## 2012-08-31 ENCOUNTER — Telehealth: Payer: Self-pay | Admitting: Obstetrics and Gynecology

## 2012-08-31 NOTE — Telephone Encounter (Signed)
Returned pt's call. No record of call to pt. States no message was left. Apology given.

## 2012-08-31 NOTE — Progress Notes (Signed)
Pt here for pre-op of robotic myomectomy. Seen by EP Questions all answered

## 2012-09-02 ENCOUNTER — Encounter (HOSPITAL_COMMUNITY)
Admission: RE | Admit: 2012-09-02 | Discharge: 2012-09-02 | Disposition: A | Discharge status: Home / Self Care | Payer: Managed Care, Other (non HMO) | Source: Ambulatory Visit | Attending: Obstetrics and Gynecology | Admitting: Obstetrics and Gynecology

## 2012-09-02 ENCOUNTER — Encounter (HOSPITAL_COMMUNITY): Payer: Self-pay

## 2012-09-02 HISTORY — DX: Type 2 diabetes mellitus without complications: E11.9

## 2012-09-02 HISTORY — DX: Anemia, unspecified: D64.9

## 2012-09-02 HISTORY — DX: Essential (primary) hypertension: I10

## 2012-09-02 LAB — COMPREHENSIVE METABOLIC PANEL
Albumin: 3.8 g/dL (ref 3.5–5.2)
Alkaline Phosphatase: 48 U/L (ref 39–117)
BUN: 15 mg/dL (ref 6–23)
Calcium: 9.3 mg/dL (ref 8.4–10.5)
Creatinine, Ser: 0.75 mg/dL (ref 0.50–1.10)
Potassium: 3.7 mEq/L (ref 3.5–5.1)
Total Protein: 7.6 g/dL (ref 6.0–8.3)

## 2012-09-02 LAB — SURGICAL PCR SCREEN: MRSA, PCR: NEGATIVE

## 2012-09-02 LAB — CBC
HCT: 32 % — ABNORMAL LOW (ref 36.0–46.0)
MCH: 27.1 pg (ref 26.0–34.0)
MCHC: 32.8 g/dL (ref 30.0–36.0)
RDW: 15.7 % — ABNORMAL HIGH (ref 11.5–15.5)

## 2012-09-02 NOTE — Patient Instructions (Signed)
Your procedure is scheduled on:09/07/12  Enter through the Main Entrance at :11am Pick up desk phone and dial [phone] and inform us of your arrival.  Please call [phone] if you have any problems the morning of surgery.  Remember: Do not eat after midnight:Tuesday Clear liquids ok until 8am on Wed   Take these meds the morning of surgery with a sip of water:BP med, bring inhaler to hospital  DO NOT wear jewelry, eye make-up, lipstick,body lotion, or dark fingernail polish. Do not shave for 48 hours prior to surgery.  If you are to be admitted after surgery, leave suitcase in car until your room has been assigned. Patients discharged on the day of surgery will not be allowed to drive home.

## 2012-09-06 MED ORDER — CEFOTETAN DISODIUM 2 G IJ SOLR
2.0000 g | INTRAMUSCULAR | Status: AC
  Administered 2012-09-07: 2 g via INTRAVENOUS
  Filled 2012-09-06: qty 2

## 2012-09-07 ENCOUNTER — Ambulatory Visit (HOSPITAL_COMMUNITY): Payer: Managed Care, Other (non HMO) | Admitting: Anesthesiology

## 2012-09-07 ENCOUNTER — Ambulatory Visit (HOSPITAL_COMMUNITY)
Admission: RE | Admit: 2012-09-07 | Discharge: 2012-09-08 | Disposition: A | Discharge status: Home / Self Care | Payer: Managed Care, Other (non HMO) | Source: Ambulatory Visit | Attending: Obstetrics and Gynecology | Admitting: Obstetrics and Gynecology

## 2012-09-07 ENCOUNTER — Encounter (HOSPITAL_COMMUNITY)
Admission: RE | Disposition: A | Discharge status: Home / Self Care | Payer: Self-pay | Source: Ambulatory Visit | Attending: Obstetrics and Gynecology

## 2012-09-07 ENCOUNTER — Encounter (HOSPITAL_COMMUNITY): Payer: Self-pay | Admitting: Anesthesiology

## 2012-09-07 DIAGNOSIS — N83209 Unspecified ovarian cyst, unspecified side: Secondary | ICD-10-CM | POA: Insufficient documentation

## 2012-09-07 DIAGNOSIS — N831 Corpus luteum cyst of ovary, unspecified side: Secondary | ICD-10-CM | POA: Insufficient documentation

## 2012-09-07 DIAGNOSIS — N920 Excessive and frequent menstruation with regular cycle: Principal | ICD-10-CM | POA: Insufficient documentation

## 2012-09-07 DIAGNOSIS — D649 Anemia, unspecified: Secondary | ICD-10-CM | POA: Insufficient documentation

## 2012-09-07 DIAGNOSIS — D259 Leiomyoma of uterus, unspecified: Secondary | ICD-10-CM | POA: Insufficient documentation

## 2012-09-07 DIAGNOSIS — N946 Dysmenorrhea, unspecified: Secondary | ICD-10-CM | POA: Insufficient documentation

## 2012-09-07 HISTORY — PX: ROBOT ASSISTED MYOMECTOMY: SHX5142

## 2012-09-07 LAB — PREGNANCY, URINE: Preg Test, Ur: NEGATIVE

## 2012-09-07 LAB — TYPE AND SCREEN

## 2012-09-07 SURGERY — ROBOTIC ASSISTED MYOMECTOMY
Anesthesia: General | Site: Abdomen | Wound class: Clean Contaminated

## 2012-09-07 MED ORDER — IBUPROFEN 600 MG PO TABS
600.0000 mg | ORAL_TABLET | Freq: Four times a day (QID) | ORAL | Status: DC | PRN
  Administered 2012-09-07: 600 mg via ORAL
  Filled 2012-09-07: qty 1

## 2012-09-07 MED ORDER — FENTANYL CITRATE 0.05 MG/ML IJ SOLN
INTRAMUSCULAR | Status: AC
  Administered 2012-09-07: 50 ug via INTRAVENOUS
  Filled 2012-09-07: qty 2

## 2012-09-07 MED ORDER — MONTELUKAST SODIUM 10 MG PO TABS
10.0000 mg | ORAL_TABLET | Freq: Every day | ORAL | Status: DC
  Filled 2012-09-07: qty 1

## 2012-09-07 MED ORDER — METOCLOPRAMIDE HCL 5 MG/ML IJ SOLN: 10.0000 mg | Freq: Once | INTRAMUSCULAR | Status: AC | PRN

## 2012-09-07 MED ORDER — GLYCOPYRROLATE 0.2 MG/ML IJ SOLN
INTRAMUSCULAR | Status: DC | PRN
  Administered 2012-09-07: .8 mg via INTRAVENOUS
  Administered 2012-09-07: 0.2 mg via INTRAVENOUS

## 2012-09-07 MED ORDER — PROPOFOL 10 MG/ML IV EMUL
INTRAVENOUS | Status: DC | PRN
  Administered 2012-09-07 (×2): 50 mg via INTRAVENOUS
  Administered 2012-09-07: 200 mg via INTRAVENOUS
  Administered 2012-09-07: 100 mg via INTRAVENOUS

## 2012-09-07 MED ORDER — ONDANSETRON HCL 4 MG PO TABS: 4.0000 mg | ORAL_TABLET | Freq: Three times a day (TID) | ORAL | Status: DC | PRN

## 2012-09-07 MED ORDER — KETOROLAC TROMETHAMINE 60 MG/2ML IM SOLN
INTRAMUSCULAR | Status: DC | PRN
  Administered 2012-09-07: 30 mg via INTRAMUSCULAR

## 2012-09-07 MED ORDER — FENTANYL CITRATE 0.05 MG/ML IJ SOLN
INTRAMUSCULAR | Status: AC
  Filled 2012-09-07: qty 2

## 2012-09-07 MED ORDER — PHENYLEPHRINE 40 MCG/ML (10ML) SYRINGE FOR IV PUSH (FOR BLOOD PRESSURE SUPPORT)
PREFILLED_SYRINGE | INTRAVENOUS | Status: AC
  Filled 2012-09-07: qty 5

## 2012-09-07 MED ORDER — HYDROMORPHONE HCL PF 1 MG/ML IJ SOLN
INTRAMUSCULAR | Status: AC
  Filled 2012-09-07: qty 1

## 2012-09-07 MED ORDER — FENTANYL CITRATE 0.05 MG/ML IJ SOLN
INTRAMUSCULAR | Status: DC | PRN
  Administered 2012-09-07: 100 ug via INTRAVENOUS
  Administered 2012-09-07 (×2): 50 ug via INTRAVENOUS
  Administered 2012-09-07 (×2): 100 ug via INTRAVENOUS

## 2012-09-07 MED ORDER — NEOSTIGMINE METHYLSULFATE 1 MG/ML IJ SOLN
INTRAMUSCULAR | Status: DC | PRN
  Administered 2012-09-07: 4 mg via INTRAVENOUS

## 2012-09-07 MED ORDER — BUDESONIDE-FORMOTEROL FUMARATE 160-4.5 MCG/ACT IN AERO
2.0000 | INHALATION_SPRAY | Freq: Two times a day (BID) | RESPIRATORY_TRACT | Status: DC
  Administered 2012-09-07: 2 via RESPIRATORY_TRACT
  Filled 2012-09-07: qty 6

## 2012-09-07 MED ORDER — LIDOCAINE HCL (CARDIAC) 20 MG/ML IV SOLN
INTRAVENOUS | Status: DC | PRN
  Administered 2012-09-07: 80 mg via INTRAVENOUS

## 2012-09-07 MED ORDER — ONDANSETRON HCL 4 MG/2ML IJ SOLN
INTRAMUSCULAR | Status: AC
  Filled 2012-09-07: qty 2

## 2012-09-07 MED ORDER — ARTIFICIAL TEARS OP OINT
TOPICAL_OINTMENT | OPHTHALMIC | Status: AC
  Filled 2012-09-07: qty 3.5

## 2012-09-07 MED ORDER — ACETAMINOPHEN 10 MG/ML IV SOLN
INTRAVENOUS | Status: DC | PRN
  Administered 2012-09-07: 1000 mg via INTRAVENOUS

## 2012-09-07 MED ORDER — OXYCODONE-ACETAMINOPHEN 5-325 MG PO TABS: 1.0000 | ORAL_TABLET | ORAL | Status: DC | PRN

## 2012-09-07 MED ORDER — NEOSTIGMINE METHYLSULFATE 1 MG/ML IJ SOLN
INTRAMUSCULAR | Status: AC
  Filled 2012-09-07: qty 1

## 2012-09-07 MED ORDER — VASOPRESSIN 20 UNIT/ML IJ SOLN
INTRAVENOUS | Status: DC | PRN
  Administered 2012-09-07: 15:00:00 via INTRAMUSCULAR

## 2012-09-07 MED ORDER — MIDAZOLAM HCL 5 MG/5ML IJ SOLN
INTRAMUSCULAR | Status: DC | PRN
  Administered 2012-09-07: 2 mg via INTRAVENOUS

## 2012-09-07 MED ORDER — MEPERIDINE HCL 25 MG/ML IJ SOLN: 6.2500 mg | INTRAMUSCULAR | Status: DC | PRN

## 2012-09-07 MED ORDER — SCOPOLAMINE 1 MG/3DAYS TD PT72
1.0000 | MEDICATED_PATCH | TRANSDERMAL | Status: DC
  Administered 2012-09-07: 1.5 mg via TRANSDERMAL

## 2012-09-07 MED ORDER — DEXAMETHASONE SODIUM PHOSPHATE 10 MG/ML IJ SOLN
INTRAMUSCULAR | Status: DC | PRN
  Administered 2012-09-07: 10 mg via INTRAVENOUS

## 2012-09-07 MED ORDER — ACETAMINOPHEN 10 MG/ML IV SOLN
INTRAVENOUS | Status: AC
  Filled 2012-09-07: qty 100

## 2012-09-07 MED ORDER — DEXAMETHASONE SODIUM PHOSPHATE 10 MG/ML IJ SOLN
INTRAMUSCULAR | Status: AC
  Filled 2012-09-07: qty 1

## 2012-09-07 MED ORDER — SCOPOLAMINE 1 MG/3DAYS TD PT72
MEDICATED_PATCH | TRANSDERMAL | Status: AC
  Filled 2012-09-07: qty 1

## 2012-09-07 MED ORDER — LACTATED RINGERS IV SOLN
INTRAVENOUS | Status: DC
  Administered 2012-09-07: 20:00:00 via INTRAVENOUS

## 2012-09-07 MED ORDER — METOCLOPRAMIDE HCL 5 MG/ML IJ SOLN
INTRAMUSCULAR | Status: AC
  Administered 2012-09-07: 10 mg via INTRAVENOUS
  Filled 2012-09-07: qty 2

## 2012-09-07 MED ORDER — FENTANYL CITRATE 0.05 MG/ML IJ SOLN
25.0000 ug | INTRAMUSCULAR | Status: DC | PRN
  Administered 2012-09-07: 50 ug via INTRAVENOUS

## 2012-09-07 MED ORDER — MIDAZOLAM HCL 2 MG/2ML IJ SOLN
INTRAMUSCULAR | Status: AC
  Filled 2012-09-07: qty 2

## 2012-09-07 MED ORDER — ROCURONIUM BROMIDE 100 MG/10ML IV SOLN
INTRAVENOUS | Status: DC | PRN
  Administered 2012-09-07: 45 mg via INTRAVENOUS
  Administered 2012-09-07: 5 mg via INTRAVENOUS
  Administered 2012-09-07: 10 mg via INTRAVENOUS
  Administered 2012-09-07: 5 mg via INTRAVENOUS
  Administered 2012-09-07: 20 mg via INTRAVENOUS

## 2012-09-07 MED ORDER — LACTATED RINGERS IR SOLN
Status: DC | PRN
  Administered 2012-09-07: 500 mL

## 2012-09-07 MED ORDER — LIDOCAINE HCL (CARDIAC) 20 MG/ML IV SOLN
INTRAVENOUS | Status: AC
  Filled 2012-09-07: qty 5

## 2012-09-07 MED ORDER — ARTIFICIAL TEARS OP OINT
TOPICAL_OINTMENT | OPHTHALMIC | Status: DC | PRN
  Administered 2012-09-07: 1 via OPHTHALMIC

## 2012-09-07 MED ORDER — METHYLENE BLUE 1 % INJ SOLN
INTRAMUSCULAR | Status: DC | PRN
  Administered 2012-09-07: 1 mL via SUBMUCOSAL

## 2012-09-07 MED ORDER — GLYCOPYRROLATE 0.2 MG/ML IJ SOLN
INTRAMUSCULAR | Status: AC
  Filled 2012-09-07: qty 4

## 2012-09-07 MED ORDER — FENTANYL CITRATE 0.05 MG/ML IJ SOLN
INTRAMUSCULAR | Status: AC
  Filled 2012-09-07: qty 5

## 2012-09-07 MED ORDER — LACTATED RINGERS IV SOLN
INTRAVENOUS | Status: DC
  Administered 2012-09-07 (×3): via INTRAVENOUS

## 2012-09-07 MED ORDER — ROCURONIUM BROMIDE 50 MG/5ML IV SOLN
INTRAVENOUS | Status: AC
  Filled 2012-09-07: qty 1

## 2012-09-07 MED ORDER — LACTATED RINGERS IR SOLN
Status: DC | PRN
  Administered 2012-09-07: 3000 mL

## 2012-09-07 MED ORDER — PHENYLEPHRINE HCL 10 MG/ML IJ SOLN
INTRAMUSCULAR | Status: DC | PRN
  Administered 2012-09-07: 40 ug via INTRAVENOUS

## 2012-09-07 MED ORDER — BUPIVACAINE HCL (PF) 0.25 % IJ SOLN
INTRAMUSCULAR | Status: DC | PRN
  Administered 2012-09-07: 11 mL

## 2012-09-07 MED ORDER — KETOROLAC TROMETHAMINE 30 MG/ML IJ SOLN
INTRAMUSCULAR | Status: DC | PRN
  Administered 2012-09-07: 30 mg via INTRAVENOUS

## 2012-09-07 MED ORDER — NIFEDIPINE ER 30 MG PO TB24
30.0000 mg | ORAL_TABLET | Freq: Every day | ORAL | Status: DC
  Filled 2012-09-07: qty 1

## 2012-09-07 MED ORDER — GLYCOPYRROLATE 0.2 MG/ML IJ SOLN
INTRAMUSCULAR | Status: AC
  Filled 2012-09-07: qty 1

## 2012-09-07 MED ORDER — MENTHOL 3 MG MT LOZG: 1.0000 | LOZENGE | OROMUCOSAL | Status: DC | PRN

## 2012-09-07 MED ORDER — ALBUTEROL SULFATE HFA 108 (90 BASE) MCG/ACT IN AERS
2.0000 | INHALATION_SPRAY | Freq: Four times a day (QID) | RESPIRATORY_TRACT | Status: DC | PRN
  Filled 2012-09-07: qty 6.7

## 2012-09-07 MED ORDER — ONDANSETRON HCL 4 MG/2ML IJ SOLN
INTRAMUSCULAR | Status: DC | PRN
  Administered 2012-09-07 (×2): 4 mg via INTRAVENOUS

## 2012-09-07 MED ORDER — HYDROMORPHONE HCL PF 1 MG/ML IJ SOLN
INTRAMUSCULAR | Status: DC | PRN
  Administered 2012-09-07: 1 mg via INTRAVENOUS

## 2012-09-07 MED ORDER — PROPOFOL 10 MG/ML IV EMUL
INTRAVENOUS | Status: AC
  Filled 2012-09-07: qty 40

## 2012-09-07 SURGICAL SUPPLY — 75 items
ADH SKN CLS APL DERMABOND .7 (GAUZE/BANDAGES/DRESSINGS) ×1
APL SKNCLS STERI-STRIP NONHPOA (GAUZE/BANDAGES/DRESSINGS) ×1
BARRIER ADHS 3X4 INTERCEED (GAUZE/BANDAGES/DRESSINGS) ×2 IMPLANT
BENZOIN TINCTURE PRP APPL 2/3 (GAUZE/BANDAGES/DRESSINGS) ×2 IMPLANT
BLADE LAP MORCELLATOR 15X9.5 (ELECTROSURGICAL) ×2 IMPLANT
BLADE LAPAROSCOPIC MORCELL KIT (BLADE) ×2 IMPLANT
BRR ADH 4X3 ABS CNTRL BYND (GAUZE/BANDAGES/DRESSINGS) ×1
CANNULA SEAL DVNC (CANNULA) ×3 IMPLANT
CANNULA SEALS DA VINCI (CANNULA) ×3
CATH FOLEY 3WAY  5CC 18FR (CATHETERS) ×1
CATH FOLEY 3WAY 5CC 18FR (CATHETERS) ×1 IMPLANT
CHLORAPREP W/TINT 26ML (MISCELLANEOUS) ×2 IMPLANT
CLOTH BEACON ORANGE TIMEOUT ST (SAFETY) ×2 IMPLANT
CONT PATH 16OZ SNAP LID 3702 (MISCELLANEOUS) ×2 IMPLANT
COVER MAYO STAND STRL (DRAPES) ×2 IMPLANT
COVER TABLE BACK 60X90 (DRAPES) ×4 IMPLANT
COVER TIP SHEARS 8 DVNC (MISCELLANEOUS) ×1 IMPLANT
COVER TIP SHEARS 8MM DA VINCI (MISCELLANEOUS) ×1
DECANTER SPIKE VIAL GLASS SM (MISCELLANEOUS) ×4 IMPLANT
DERMABOND ADVANCED (GAUZE/BANDAGES/DRESSINGS) ×1
DERMABOND ADVANCED .7 DNX12 (GAUZE/BANDAGES/DRESSINGS) ×1 IMPLANT
DRAPE HUG U DISPOSABLE (DRAPE) ×2 IMPLANT
DRAPE LG THREE QUARTER DISP (DRAPES) ×4 IMPLANT
DRAPE PROXIMA HALF (DRAPES) IMPLANT
DRAPE WARM FLUID 44X44 (DRAPE) ×2 IMPLANT
ELECT REM PT RETURN 9FT ADLT (ELECTROSURGICAL) ×2
ELECTRODE REM PT RTRN 9FT ADLT (ELECTROSURGICAL) ×1 IMPLANT
EVACUATOR SMOKE 8.L (FILTER) ×2 IMPLANT
GAUZE VASELINE 3X9 (GAUZE/BANDAGES/DRESSINGS) IMPLANT
GLOVE BIOGEL PI IND STRL 7.0 (GLOVE) ×2 IMPLANT
GLOVE BIOGEL PI INDICATOR 7.0 (GLOVE) ×2
GLOVE ECLIPSE 6.5 STRL STRAW (GLOVE) ×6 IMPLANT
GOWN STRL REIN XL XLG (GOWN DISPOSABLE) ×12 IMPLANT
IV SET ADMIN PUMP GEMINI W/NLD (IV SETS) IMPLANT
KIT ABG SYR 3ML LUER SLIP (SYRINGE) ×2 IMPLANT
KIT ACCESSORY DA VINCI DISP (KITS) ×1
KIT ACCESSORY DVNC DISP (KITS) ×1 IMPLANT
LEGGING LITHOTOMY PAIR STRL (DRAPES) ×2 IMPLANT
MANIPULATOR UTERINE 4.5 ZUMI (MISCELLANEOUS) IMPLANT
NEEDLE HYPO 22GX1.5 SAFETY (NEEDLE) ×2 IMPLANT
NEEDLE INSUFFLATION 120MM (ENDOMECHANICALS) ×2 IMPLANT
NS IRRIG 1000ML POUR BTL (IV SOLUTION) ×6 IMPLANT
PACK LAVH (CUSTOM PROCEDURE TRAY) ×2 IMPLANT
SET CYSTO W/LG BORE CLAMP LF (SET/KITS/TRAYS/PACK) IMPLANT
SET IRRIG TUBING LAPAROSCOPIC (IRRIGATION / IRRIGATOR) ×2 IMPLANT
SOLUTION ELECTROLUBE (MISCELLANEOUS) ×2 IMPLANT
STRIP CLOSURE SKIN 1/2X4 (GAUZE/BANDAGES/DRESSINGS) ×2 IMPLANT
SUT MNCRL AB 3-0 PS2 27 (SUTURE) ×4 IMPLANT
SUT VIC AB 0 CT1 27 (SUTURE) ×6
SUT VIC AB 0 CT1 27XBRD ANTBC (SUTURE) ×3 IMPLANT
SUT VIC AB 0 CT2 27 (SUTURE) IMPLANT
SUT VIC AB 2-0 CT2 27 (SUTURE) ×2 IMPLANT
SUT VICRYL 0 UR6 27IN ABS (SUTURE) ×4 IMPLANT
SUT VLOC 180 0 9IN  GS21 (SUTURE) ×5
SUT VLOC 180 0 9IN GS21 (SUTURE) ×5 IMPLANT
SUT VLOC 180 2-0 9IN GS21 (SUTURE) ×2 IMPLANT
SYR 30ML LL (SYRINGE) ×4 IMPLANT
SYR 3ML 23GX1 SAFETY (SYRINGE) ×2 IMPLANT
SYR 50ML LL SCALE MARK (SYRINGE) ×2 IMPLANT
SYR 5ML LL (SYRINGE) ×4 IMPLANT
SYRINGE 10CC LL (SYRINGE) ×2 IMPLANT
SYSTEM CONVERTIBLE TROCAR (TROCAR) IMPLANT
TIP UTERINE 5.1X6CM LAV DISP (MISCELLANEOUS) IMPLANT
TIP UTERINE 6.7X10CM GRN DISP (MISCELLANEOUS) IMPLANT
TIP UTERINE 6.7X6CM WHT DISP (MISCELLANEOUS) IMPLANT
TIP UTERINE 6.7X8CM BLUE DISP (MISCELLANEOUS) ×2 IMPLANT
TOWEL OR 17X24 6PK STRL BLUE (TOWEL DISPOSABLE) ×6 IMPLANT
TRAY FOLEY CATH 16FR SILVER (SET/KITS/TRAYS/PACK) ×2 IMPLANT
TROCAR 12M 150ML BLUNT (TROCAR) IMPLANT
TROCAR DISP BLADELESS 8 DVNC (TROCAR) ×1 IMPLANT
TROCAR DISP BLADELESS 8MM (TROCAR) ×1
TROCAR OPTI TIP 12M 100M (ENDOMECHANICALS) IMPLANT
TROCAR XCEL 12X100 BLDLESS (ENDOMECHANICALS) ×2 IMPLANT
TROCAR XCEL DIL TIP R 11M (ENDOMECHANICALS) ×2 IMPLANT
TUBING FILTER THERMOFLATOR (ELECTROSURGICAL) ×2 IMPLANT

## 2012-09-07 NOTE — Anesthesia Procedure Notes (Signed)
Procedure Name: Intubation Date/Time: 09/07/2012 1:24 PM Performed by: Graciela Husbands Pre-anesthesia Checklist: Suction available, Emergency Drugs available, Timeout performed, Patient identified and Patient being monitored Patient Re-evaluated:Patient Re-evaluated prior to inductionOxygen Delivery Method: Circle system utilized Preoxygenation: Pre-oxygenation with 100% oxygen Intubation Type: Combination inhalational/ intravenous induction Ventilation: Mask ventilation without difficulty Laryngoscope Size: Mac and 4 Grade View: Grade II Tube type: Oral Tube size: 7.0 mm Number of attempts: 1 Airway Equipment and Method: Patient positioned with wedge pillow and Stylet Placement Confirmation: ETT inserted through vocal cords under direct vision,  positive ETCO2 and breath sounds checked- equal and bilateral Secured at: 22 cm Tube secured with: Tape Dental Injury: Teeth and Oropharynx as per pre-operative assessment

## 2012-09-07 NOTE — Op Note (Addendum)
Preoperative diagnosis: Symptomatic uterine fibroids  Postoperative diagnosis: Same with pelvic adhesions and right ovarian cyst  Anesthesia: Gen.  Anesthesiologist: Dr. Arby Barrette  Procedure: Robotically-assisted myomectomy with lysis of adhesion, right ovarian cystectomy and chromopertubation  Surgeon: Dr. Dois Davenport Ambert Virrueta  Assistant: Henreitta Leber P.A.-C.  Estimated blood loss: Less than 100 cc  Procedure:  After being informed of the planned procedure and possible complications including but not limited to bleeding, infection, injury to other organs, postoperative tubal occlusion, expected length of surgery, expected hospitals they in recovery, informed consent was obtained and patient was taken to or #7 and given general anesthesia with endotracheal intubation without any complication. She was placed in the lithotomy position on a sticky mattress and beanbag with both arms padded and tucked on each side and knee-high sequential compressive devices. She was then prepped and draped in a sterile fashion.  Pelvic exam reveals a enlarged uterus approximately 18 weeks in size that is very mobile. Adnexa are not felt to to uterine size and body habitus. A weighted speculum is inserted in the vagina and the cervix is grasped with a tenaculum forcep. The uterus is sounded at 11 cm. The cervix was easily dilated using Hegar dilator until #28 which allows easy placement of a RUMI intrauterine manipulator #8 and a 3.5 KOH ring. The balloon is inflated with 10 cc of saline. The weighted speculum is removed and a three-way Foley catheter is inserted in the bladder.  We measured 12 cm above the fundus and infiltrated in the midline with 5 cc of Marcaine 0.25. We perform a 10 mm vertical incision which is brought down bluntly to the fascia. The fascia is identified and grasped with Coker forceps and incised with Mayo scissors. Peritoneum is entered bluntly. A pursestring suture of 0 Vicryl is placed on the  fascia and a 10 mm Hassan trocar is easily inserted in the abdominal cavity held in place with a Purstring suture. This allows for easy insufflation of a pneumoperitoneum using warm to CO2 at a maximum pressure of 15 mm of mercury.  A quick look into the abdominal cavity reveals a few adhesions between the omentum and the abdominal wall. We can see the outline of the uterus in the back that R. and able to evaluate it completely. Trocar placement is decided and we insert 2 8mm robotic trocar on the left, 1 8mm robotic trocar on the right in one 10 mm patient side assistant trocar on the right all under direct position after infiltrating each site with Marcaine 0.25. The robot is docked on the left side of the patient. We insert a monopolar scissor in arm #1, a PK gyrus forcep and arm #2 and a tenaculum forcep and arm #3. Preparation and docking is completed in 45 minutes.  We proceed with sharp lysis of adhesions to free the omentum from the abdominal wall using bipolar and monopolar cauterization. This then gives [place] access to the pelvis where we can evaluate a bulky uterus with a dominant posterior fibroid. We are able to visualize the left tube which appears completely normal with a normal fimbriated and a normal left ovary. The right ovary is not seen at this time. The appendix is seen is normal. Liver and gallbladder are seen and normal.  We positioned the uterus to approach it posteriorly away from the left tube. We infiltrate the serosa using 24 cc of vasopressin 40 units diluted in 100 cc of saline. This gives [place] a nice blanching of the serosa and  allows [place] to open the serosa with an open monopolar scissors on a distance of approximately 7 cm. We then proceed with systematic dissection of this large dominant posterior fibroid using traction and countertraction as well as sharp and blunt dissection. We are able to free the whole fibroid and deposited in the posterior cul-de-sac which gives the uterus  elevated. Behind this large fibroid are to other fibroids that are removed easily measuring 2 and 3 cm. These are deposited in the anterior cul-de-sac. We did not enter the endometrial cavity but we've reached it. Chromopertubation reveals no leakage of methylene blue through the uterine incision and no leakage through the tube at this time. Nevertheless to do to the posterior large incision going through the full thickness of the myometrium and a previous C-section I will recommend C-section for future pregnancies.  Instruments are then modified for a suture cut in arm #1, a long tip forcep and arm #2 and a PK gyrus forcep and arm #3. We then proceed with closure of the myometrial defect in the for planes using a running sutures of 0-V-Lock. The uterus  Folds back easily. We then closed the serosa in a baseball suture of 2-0 V-Lock. Hemostasis is adequate. We irrigated profusely to confirm this. Now the uterus is back to normal size we have access to the right ovary which reveals a 4 cm ovarian cyst with a thin wall and no abnormal vessels. Decision is made to proceed with ovarian cystectomy.  Instruments are then modified for a monopolar scissors and arm #1 a PK gyrus forcep and arm #2 and a long tip forcep and arm #3. The cyst is opened with Potts scissors without rupture and is dissected away from the ovarian wall intact. It is deposited in the anterior cul-de-sac for future retrieval. We proceed with cauterization of the ovarian bed until we have satisfactory hemostasis. We again irrigated profusely with warm saline and confirm hemostasis.  Consult time is then completed in 2 hours.  The robot is undocked after removal of all instruments. The 10 mm  Patient side assistant  trocar is removed and replaced with the morcellator under direct vision. We proceed with morcellation of the 3 fibroids as well as retrieval of the ovarian cyst. This is completed in 15 minutes.  We irrigated profusely with warm  saline and confirm a satisfactory hemostasis. A full sheet of Interceed is then positioned on the uterus to cover the incision.  All instruments are then removed. All trochars are removed under direct visualization after evacuating the pneumoperitoneum.  We closed the fascia of the supraumbilical incision with the previously placed pursestring suture of 0 Vicryl. We closed the fascia of the 10 mm right side incision with a figure-of-eight stitch of 0 Vicryl. Skin is closed with subcuticular suture of 3-0 Monocryl and  Dermabond.  A vaginal exam reveals bleeding on the anterior lip of the cervix which is controlled with a figure-of-eight stitch of 3-0 Vicryl.  Instrument and sponge count is complete x2. Estimated blood loss is less than 100 cc. The procedure is well tolerated by patient is taken to recovery room in a well and stable condition.  Specimen right ovarian cyst. The fibroid fragments weighing 224 g sent to pathology

## 2012-09-07 NOTE — Anesthesia Preprocedure Evaluation (Addendum)
Anesthesia Evaluation  Patient identified by MRN, date of birth, ID band Patient awake    Reviewed: Allergy & Precautions, H&P , NPO status , Patient's Chart, lab work & pertinent test results  Airway Mallampati: II      Dental  (+) Teeth Intact   Pulmonary asthma ,  breath sounds clear to auscultation  Pulmonary exam normal       Cardiovascular hypertension, Pt. on medications negative cardio ROS  Rhythm:Regular Rate:Normal     Neuro/Psych negative neurological ROS  negative psych ROS   GI/Hepatic negative GI ROS, Neg liver ROS,   Endo/Other  diabetes, GestationalGestational DM with pregnancy  Renal/GU negative Renal ROS  negative genitourinary   Musculoskeletal negative musculoskeletal ROS (+)   Abdominal   Peds  Hematology  (+) Blood dyscrasia, Sickle cell trait and anemia ,   Anesthesia Other Findings   Reproductive/Obstetrics Leiomyoma of Uterus                          Anesthesia Physical Anesthesia Plan  ASA: II  Anesthesia Plan: General   Post-op Pain Management:    Induction: Intravenous  Airway Management Planned: Oral ETT  Additional Equipment:   Intra-op Plan:   Post-operative Plan: Extubation in OR  Informed Consent: I have reviewed the patients History and Physical, chart, labs and discussed the procedure including the risks, benefits and alternatives for the proposed anesthesia with the patient or authorized representative who has indicated his/her understanding and acceptance.   Dental advisory given  Plan Discussed with: CRNA, Anesthesiologist and Surgeon  Anesthesia Plan Comments:         Anesthesia Quick Evaluation

## 2012-09-07 NOTE — H&P (View-Only) (Signed)
Briana Ford is a 37 y.o. female G3P0011 who presents for a robot assisted myomectomy because of symptomatic fibroids, menorrhagia, dysmenorrhea and anemia.  For over four years the patient reports increasing volume and duration of menses.  Currently she will flow for 7 days with the need to change a tampon plus two super pads every two hours.  She may spot for a few days after her period stops as well as after intercourse.  She admits to a throbbing heaviness, as opposed to cramping,  that is relieved with Ibuprofen 800 mg along with positional dyspareunia and constipation.  She denies urinary tract symptoms, vaginitis symptoms, fatigue or dizziness.  In 2012 she had a normal TSH, prolactin and endometrial biopsy but in 06/2012 her H/H = 10.9/33.2 .  The patient is on iron supplementation.  A pelvic ultrasound 06/2012 showed a uterus-10.4 x 8.38 x 8.53 cm with a posterior, left intramural fibroid measuring 8.2 x 6.7 x 8.4 cm; right ovary-3.46 x 2.42 x 1.39 cm and left ovary-3.70 x 2.56 x 1.84 cm.  A review of both medical and surgical management options were reviewed with the patient and she has decided to proceed with a myomectomy.   Past Medical History  OB History: G3P0011 Ectopic 2009,  Fetal Demise due to PTL and C-section 2003  GYN History: menarche: 37 YO; LMP: 08/18/12;    Contracepton no method  The patient reports a past history of: herpes.  Remote history of abnormal PAP smear treated with LEEP in 1998 but they have been normal since;  Last PAP smear: 2013  Medical History: Hypertension, asthma, hemorrhoids and fibroids  Surgical History:  1998 Loop Electrosurgical Excision Procedure, 2006 Laparoscopic Right Salpingectomy for Ectopic, 2009 Uterine Polypectomy Denies history of blood transfusions but has severe nausea and vomiting with anesthesia.  Family History: Diabetes, cardiovascular disease, thyroid disease, depression and hypertension  Social History: Married and employed  [employer]; Denies alcohol, tobacco or illicit drug use   Outpatient Encounter Prescriptions as of 08/30/2012  Medication Sig Dispense Refill  . albuterol (PROVENTIL HFA;VENTOLIN HFA) 108 (90 BASE) MCG/ACT inhaler Inhale 2 puffs into the lungs every 6 (six) hours as needed for wheezing.      . budesonide-formoterol (SYMBICORT) 160-4.5 MCG/ACT inhaler Inhale 2 puffs into the lungs 2 (two) times daily.      . desloratadine (CLARINEX) 5 MG tablet Take 5 mg by mouth daily.      . ibuprofen (ADVIL,MOTRIN) 600 MG tablet Take 1 tablet (600 mg total) by mouth every 6 (six) hours as needed for pain.  30 tablet  0  . montelukast (SINGULAIR) 10 MG tablet Take 10 mg by mouth at bedtime.      . NIFEdipine (PROCARDIA-XL/ADALAT-CC/NIFEDICAL-XL) 30 MG 24 hr tablet Take 30 mg by mouth daily.      . polycarbophil (FIBERCON) 625 MG tablet Take 625 mg by mouth daily.      . zolpidem (AMBIEN) 10 MG tablet Take 5-10 mg by mouth at bedtime as needed for sleep.       No facility-administered encounter medications on file as of 08/30/2012.    No Known Allergies  Denies sensitivity to peanuts, shellfish, soy, latex or adhesives.   ROS: Admits to glasses but denies headache, vision changes, nasal congestion, dysphagia, tinnitus, dizziness, hoarseness, cough,  chest pain, shortness of breath, nausea, vomiting, diarrhea, urinary frequency, urgency  dysuria, hematuria, vaginitis symptoms, swelling of joints, easy bruising,  myalgias, arthralgias, skin rashes, unexplained weight loss and except   as is mentioned in the history of present illness, patient's review of systems is otherwise negative.   Physical Exam    BP 116/80  Pulse 82  Temp(Src) 98.2 F (36.8 C) (Oral)  Resp 14  Ht 5' 1" (1.549 m)  Wt 184 lb (83.462 kg)  BMI 34.78 kg/m2  LMP 08/18/2012  Neck: supple without masses or thyromegaly Lungs: clear to auscultation Heart: regular rate and rhythm Abdomen: soft, tender mass  arising from pelvis-3 fingers above symphysis pubis and no organomegaly Pelvic:EGBUS- wnl; vagina-normal rugae; uterus-14 weeks size, minimally mobile,  cervix posterior, without lesions or motion tenderness; adnexae-no tenderness or masses Extremities:  no clubbing, cyanosis or edema   Assesment:  Symptomatic Uterine Fibroids                        Menorrhagia                        Dysmenorrhea                        Anemia   Disposition:  Disposition:  A discussion was held with patient regarding the indication for her procedure(s) along with the risks, which include but are not limited to: reaction to anesthesia, damage to adjacent organs, infection and excessive bleeding,  possible need for a C-section should she become pregnant and possible need for an open abdominal incision to safely complete the surgery. .  Patient also understands that the robotic approach to her surgery requires more time than that of an open abdominal incision, that she will experience transient post operative facial edema, that her hospital stay is expected to be 0-2 days and return to regular activities in 2-3 weeks (with the exception of intercourse which will be 6 weeks).  The patient was given the Miralax bowel prep to be completed 24 hours prior to her surgery.  The patient verbalized understanding of these risks and pre-operative instructions and has consented to proceed with a Robot Assisted Myomectomy at [facility] on March 5 , 2014 at 12:30 pm.   CSN# [id number]   Anakaren Campion J. Olyn Landstrom, PA-C  for Dr. Sandra A. Rivard   

## 2012-09-07 NOTE — Transfer of Care (Signed)
Immediate Anesthesia Transfer of Care Note  Patient: Briana Ford  Procedure(s) Performed: Procedure(s): ROBOTIC ASSISTED MYOMECTOMY (N/A)  Patient Location: PACU  Anesthesia Type:General  Level of Consciousness: awake, alert  and oriented  Airway & Oxygen Therapy: Patient Spontanous Breathing and Patient connected to nasal cannula oxygen  Post-op Assessment: Report given to PACU RN and Post -op Vital signs reviewed and stable  Post vital signs: Reviewed and stable  Complications: No apparent anesthesia complications

## 2012-09-07 NOTE — OR Nursing (Signed)
Surgeon changed her vasopressin mixture to 40units vasopressin  In 100 mls ofsaline

## 2012-09-07 NOTE — Interval H&P Note (Signed)
History and Physical Interval Note:  09/07/2012 12:58 PM  Briana Ford  has presented today for surgery, with the diagnosis of Fibroids; CPT [phone], s2900  The various methods of treatment have been discussed with the patient and family. After consideration of risks, benefits and other options for treatment, the patient has consented to  Procedure(s): ROBOTIC ASSISTED MYOMECTOMY (N/A) as a surgical intervention .  The patient's history has been reviewed, patient examined, no change in status, stable for surgery.  I have reviewed the patient's chart and labs.  Questions were answered to the patient's satisfaction.     RIVARD,SANDRA A

## 2012-09-07 NOTE — Discharge Summary (Signed)
Physician Discharge Summary  Patient ID: Briana Ford MRN: [id number] DOB/AGE: [birth year] 37 y.o.  Admit date: 09/07/2012 Discharge date: 09/07/2012   Discharge Diagnoses: Symptomatic Uterine Fibroids,  Menorrhagia, Dysmenorrhea and Anemia Active Problems:   * No active hospital problems. *   Operation: Robot Assisted Laparoscopic Myomectomy, Lysis of Adhesions,  Right Ovarian Cystectomy and Morcellation of Fibroid   Discharged Condition: Good  Hospital Course: On the date of admission, the patient underwent the aforementioned procedures, tolerating them well.  Post operative course was unremarkable with the patient resuming bowel and bladder function and oral intake by the evening of the day of surgery and was therefore deemed ready for discharge home.  Disposition: Home to Self Care  Discharge Medications:    Medication List    TAKE these medications       albuterol 108 (90 BASE) MCG/ACT inhaler  Commonly known as:  PROVENTIL HFA;VENTOLIN HFA  Inhale 2 puffs into the lungs every 6 (six) hours as needed for wheezing.     budesonide-formoterol 160-4.5 MCG/ACT inhaler  Commonly known as:  SYMBICORT  Inhale 2 puffs into the lungs 2 (two) times daily.     desloratadine 5 MG tablet  Commonly known as:  CLARINEX  Take 5 mg by mouth daily.     ibuprofen 600 MG tablet  Commonly known as:  ADVIL,MOTRIN  Take 1 tablet (600 mg total) by mouth every 6 (six) hours as needed for pain.     montelukast 10 MG tablet  Commonly known as:  SINGULAIR  Take 10 mg by mouth at bedtime.     NIFEdipine 30 MG 24 hr tablet  Commonly known as:  PROCARDIA-XL/ADALAT-CC/NIFEDICAL-XL  Take 30 mg by mouth daily.     ondansetron 4 MG tablet  Commonly known as:  ZOFRAN  Take 1 tablet (4 mg total) by mouth every 8 (eight) hours as needed for nausea.     oxyCODONE-acetaminophen 5-325 MG per tablet  Commonly known as:  ROXICET  Take 1-2 tablets by mouth every 4 (four) hours as needed for pain.     polycarbophil 625 MG tablet  Commonly known as:  FIBERCON  Take 625 mg by mouth daily.     zolpidem 10 MG tablet  Commonly known as:  AMBIEN  Take 5-10 mg by mouth at bedtime as needed for sleep.         Follow-up: Dr. Estanislado Pandy in 3 weeks   Signed: Patrick Jupiter 09/07/2012, 6:00 PM

## 2012-09-07 NOTE — H&P (Signed)
Briana Ford is a 36 y.o. female G3P0011 who presents for a robot assisted myomectomy because of symptomatic fibroids, menorrhagia, dysmenorrhea and anemia.  For over four years the patient reports increasing volume and duration of menses.  Currently she will flow for 7 days with the need to change a tampon plus two super pads every two hours.  She may spot for a few days after her period stops as well as after intercourse.  She admits to a throbbing heaviness, as opposed to cramping,  that is relieved with Ibuprofen 800 mg along with positional dyspareunia and constipation.  She denies urinary tract symptoms, vaginitis symptoms, fatigue or dizziness.  In 2012 she had a normal TSH, prolactin and endometrial biopsy but in 06/2012 her H/H = 10.9/33.2 .  The patient is on iron supplementation.  A pelvic ultrasound 06/2012 showed a uterus-10.4 x 8.38 x 8.53 cm with a posterior, left intramural fibroid measuring 8.2 x 6.7 x 8.4 cm; right ovary-3.46 x 2.42 x 1.39 cm and left ovary-3.70 x 2.56 x 1.84 cm.  A review of both medical and surgical management options were reviewed with the patient and she has decided to proceed with a myomectomy.   Past Medical History  OB History: G3P0011 Ectopic 2009,  Fetal Demise due to PTL and C-section 2003  GYN History: menarche: 37 YO; LMP: 08/18/12;    Contracepton no method  The patient reports a past history of: herpes.  Remote history of abnormal PAP smear treated with LEEP in 1998 but they have been normal since;  Last PAP smear: 2013  Medical History: Hypertension, asthma, hemorrhoids and fibroids  Surgical History:  1998 Loop Electrosurgical Excision Procedure, 2006 Laparoscopic Right Salpingectomy for Ectopic, 2009 Uterine Polypectomy Denies history of blood transfusions but has severe nausea and vomiting with anesthesia.  Family History: Diabetes, cardiovascular disease, thyroid disease, depression and hypertension  Social History: Married and employed  [employer]; Denies alcohol, tobacco or illicit drug use   Outpatient Encounter Prescriptions as of 08/30/2012  Medication Sig Dispense Refill  . albuterol (PROVENTIL HFA;VENTOLIN HFA) 108 (90 BASE) MCG/ACT inhaler Inhale 2 puffs into the lungs every 6 (six) hours as needed for wheezing.      . budesonide-formoterol (SYMBICORT) 160-4.5 MCG/ACT inhaler Inhale 2 puffs into the lungs 2 (two) times daily.      . desloratadine (CLARINEX) 5 MG tablet Take 5 mg by mouth daily.      . ibuprofen (ADVIL,MOTRIN) 600 MG tablet Take 1 tablet (600 mg total) by mouth every 6 (six) hours as needed for pain.  30 tablet  0  . montelukast (SINGULAIR) 10 MG tablet Take 10 mg by mouth at bedtime.      . NIFEdipine (PROCARDIA-XL/ADALAT-CC/NIFEDICAL-XL) 30 MG 24 hr tablet Take 30 mg by mouth daily.      . polycarbophil (FIBERCON) 625 MG tablet Take 625 mg by mouth daily.      . zolpidem (AMBIEN) 10 MG tablet Take 5-10 mg by mouth at bedtime as needed for sleep.       No facility-administered encounter medications on file as of 08/30/2012.    No Known Allergies  Denies sensitivity to peanuts, shellfish, soy, latex or adhesives.   ROS: Admits to glasses but denies headache, vision changes, nasal congestion, dysphagia, tinnitus, dizziness, hoarseness, cough,  chest pain, shortness of breath, nausea, vomiting, diarrhea, urinary frequency, urgency  dysuria, hematuria, vaginitis symptoms, swelling of joints, easy bruising,  myalgias, arthralgias, skin rashes, unexplained weight loss and except   as is mentioned in the history of present illness, patient's review of systems is otherwise negative.   Physical Exam    BP 116/80  Pulse 82  Temp(Src) 98.2 F (36.8 C) (Oral)  Resp 14  Ht 5' 1" (1.549 m)  Wt 184 lb (83.462 kg)  BMI 34.78 kg/m2  LMP 08/18/2012  Neck: supple without masses or thyromegaly Lungs: clear to auscultation Heart: regular rate and rhythm Abdomen: soft, tender mass  arising from pelvis-3 fingers above symphysis pubis and no organomegaly Pelvic:EGBUS- wnl; vagina-normal rugae; uterus-14 weeks size, minimally mobile,  cervix posterior, without lesions or motion tenderness; adnexae-no tenderness or masses Extremities:  no clubbing, cyanosis or edema   Assesment:  Symptomatic Uterine Fibroids                        Menorrhagia                        Dysmenorrhea                        Anemia   Disposition:  Disposition:  A discussion was held with patient regarding the indication for her procedure(s) along with the risks, which include but are not limited to: reaction to anesthesia, damage to adjacent organs, infection and excessive bleeding,  possible need for a C-section should she become pregnant and possible need for an open abdominal incision to safely complete the surgery. .  Patient also understands that the robotic approach to her surgery requires more time than that of an open abdominal incision, that she will experience transient post operative facial edema, that her hospital stay is expected to be 0-2 days and return to regular activities in 2-3 weeks (with the exception of intercourse which will be 6 weeks).  The patient was given the Miralax bowel prep to be completed 24 hours prior to her surgery.  The patient verbalized understanding of these risks and pre-operative instructions and has consented to proceed with a Robot Assisted Myomectomy at [facility] on March 5 , 2014 at 12:30 pm.   CSN# [id number]   [place] J. Powell, PA-C  for Dr. Sandra A. Rivard   

## 2012-09-07 NOTE — Anesthesia Postprocedure Evaluation (Signed)
Anesthesia Post Note  Patient: Briana Ford  Procedure(s) Performed: Procedure(s) (LRB): ROBOTIC ASSISTED MYOMECTOMY (N/A)  Anesthesia type: General  Patient location: PACU  Post pain: Pain level controlled  Post assessment: Post-op Vital signs reviewed  Last Vitals:  Filed Vitals:   09/07/12 1815  BP: 121/82  Pulse: 66  Temp:   Resp: 20    Post vital signs: Reviewed  Level of consciousness: sedated  Complications: No apparent anesthesia complications

## 2012-09-08 ENCOUNTER — Encounter (HOSPITAL_COMMUNITY): Payer: Self-pay | Admitting: Obstetrics and Gynecology

## 2012-09-08 ENCOUNTER — Telehealth: Payer: Self-pay | Admitting: Obstetrics and Gynecology

## 2012-09-08 NOTE — Progress Notes (Signed)
Pt discharged to home.  Teaching complete.  Pt left unit in wheelchair accompanied by husband.

## 2012-09-08 NOTE — Telephone Encounter (Signed)
Called patient Day#1 post-op of robotic myomectomy / ovarian cystectomy. Patient reports feeling well, ambulating, voiding without difficulty and tolerating normal diet. Pain is managed mainly with Ibuprofen. Op findings reviewed with patient. Questions answered. Follow-up appointment scheduled in 2 weeks

## 2012-09-09 ENCOUNTER — Telehealth: Payer: Self-pay | Admitting: Obstetrics and Gynecology

## 2012-09-09 NOTE — Telephone Encounter (Signed)
TC to pt after she called hospital to report running a temperature. States it is 100.8, this is the first time she has taken it. She just took 800mg  motrin, instructed pt to keep alternating taking motrin/tylenol for the next 24 hours, stay well hydrated, if temperature remains .100.4, notify on-call Issis Lindseth, otherwise keep appt as scheduled. Pt denies any other complaints, otherwise doing well

## 2012-09-21 ENCOUNTER — Encounter: Payer: Self-pay | Admitting: Obstetrics and Gynecology

## 2013-10-24 ENCOUNTER — Other Ambulatory Visit: Payer: Self-pay | Admitting: Obstetrics and Gynecology

## 2013-11-08 ENCOUNTER — Encounter (HOSPITAL_COMMUNITY): Payer: Managed Care, Other (non HMO) | Admitting: Anesthesiology

## 2013-11-08 ENCOUNTER — Inpatient Hospital Stay (HOSPITAL_COMMUNITY): Payer: Managed Care, Other (non HMO) | Admitting: Anesthesiology

## 2013-11-08 ENCOUNTER — Inpatient Hospital Stay (HOSPITAL_COMMUNITY)
Admission: AD | Admit: 2013-11-08 | Discharge: 2013-11-11 | DRG: 765 | Disposition: A | Discharge status: Home / Self Care | Payer: Managed Care, Other (non HMO) | Source: Ambulatory Visit | Attending: Obstetrics and Gynecology | Admitting: Obstetrics and Gynecology

## 2013-11-08 ENCOUNTER — Encounter (HOSPITAL_COMMUNITY)
Admission: AD | Disposition: A | Discharge status: Home / Self Care | Payer: Self-pay | Source: Ambulatory Visit | Attending: Obstetrics and Gynecology

## 2013-11-08 ENCOUNTER — Encounter (HOSPITAL_COMMUNITY): Payer: Self-pay | Admitting: *Deleted

## 2013-11-08 DIAGNOSIS — O1410 Severe pre-eclampsia, unspecified trimester: Secondary | ICD-10-CM | POA: Diagnosis present

## 2013-11-08 DIAGNOSIS — O09899 Supervision of other high risk pregnancies, unspecified trimester: Secondary | ICD-10-CM

## 2013-11-08 DIAGNOSIS — O4290 Premature rupture of membranes, unspecified as to length of time between rupture and onset of labor, unspecified weeks of gestation: Secondary | ICD-10-CM | POA: Diagnosis present

## 2013-11-08 DIAGNOSIS — O3410 Maternal care for benign tumor of corpus uteri, unspecified trimester: Secondary | ICD-10-CM

## 2013-11-08 DIAGNOSIS — O9903 Anemia complicating the puerperium: Secondary | ICD-10-CM | POA: Diagnosis not present

## 2013-11-08 DIAGNOSIS — O34219 Maternal care for unspecified type scar from previous cesarean delivery: Secondary | ICD-10-CM | POA: Diagnosis present

## 2013-11-08 DIAGNOSIS — IMO0002 Reserved for concepts with insufficient information to code with codable children: Principal | ICD-10-CM | POA: Diagnosis present

## 2013-11-08 DIAGNOSIS — O09219 Supervision of pregnancy with history of pre-term labor, unspecified trimester: Secondary | ICD-10-CM

## 2013-11-08 DIAGNOSIS — I10 Essential (primary) hypertension: Secondary | ICD-10-CM | POA: Diagnosis present

## 2013-11-08 DIAGNOSIS — O119 Pre-existing hypertension with pre-eclampsia, unspecified trimester: Secondary | ICD-10-CM | POA: Diagnosis present

## 2013-11-08 DIAGNOSIS — D259 Leiomyoma of uterus, unspecified: Secondary | ICD-10-CM | POA: Diagnosis present

## 2013-11-08 DIAGNOSIS — O34599 Maternal care for other abnormalities of gravid uterus, unspecified trimester: Secondary | ICD-10-CM | POA: Diagnosis present

## 2013-11-08 DIAGNOSIS — Z98891 History of uterine scar from previous surgery: Secondary | ICD-10-CM

## 2013-11-08 DIAGNOSIS — D649 Anemia, unspecified: Secondary | ICD-10-CM | POA: Diagnosis not present

## 2013-11-08 DIAGNOSIS — O3429 Maternal care due to uterine scar from other previous surgery: Secondary | ICD-10-CM | POA: Diagnosis present

## 2013-11-08 HISTORY — DX: Unspecified infectious disease: B99.9

## 2013-11-08 HISTORY — DX: Nausea with vomiting, unspecified: Z98.890

## 2013-11-08 HISTORY — DX: Unspecified abnormal cytological findings in specimens from vagina: R87.629

## 2013-11-08 HISTORY — DX: Unspecified ovarian cyst, unspecified side: N83.209

## 2013-11-08 HISTORY — DX: Nausea with vomiting, unspecified: R11.2

## 2013-11-08 HISTORY — DX: Gestational diabetes mellitus in pregnancy, unspecified control: O24.419

## 2013-11-08 LAB — COMPREHENSIVE METABOLIC PANEL
ALK PHOS: 103 U/L (ref 39–117)
ALT: 19 U/L (ref 0–35)
AST: 25 U/L (ref 0–37)
Albumin: 2.8 g/dL — ABNORMAL LOW (ref 3.5–5.2)
BUN: 14 mg/dL (ref 6–23)
CO2: 20 meq/L (ref 19–32)
Calcium: 10.1 mg/dL (ref 8.4–10.5)
Chloride: 102 mEq/L (ref 96–112)
Creatinine, Ser: 0.69 mg/dL (ref 0.50–1.10)
GFR calc Af Amer: >90 mL/min (ref >90)
Glucose, Bld: 61 mg/dL — ABNORMAL LOW (ref 70–99)
Potassium: 4.9 mEq/L (ref 3.7–5.3)
SODIUM: 136 meq/L — AB (ref 137–147)
Total Protein: 6.5 g/dL (ref 6.0–8.3)

## 2013-11-08 LAB — PROTEIN / CREATININE RATIO, URINE
Creatinine, Urine: 102.57 mg/dL
Protein Creatinine Ratio: 0.72 — ABNORMAL HIGH (ref 0.00–0.15)
Total Protein, Urine: 74.1 mg/dL

## 2013-11-08 LAB — CBC
HCT: 37.1 % (ref 36.0–46.0)
Hemoglobin: 12.8 g/dL (ref 12.0–15.0)
MCH: 31 pg (ref 26.0–34.0)
MCHC: 34.5 g/dL (ref 30.0–36.0)
MCV: 89.8 fL (ref 78.0–100.0)
PLATELETS: 257 10*3/uL (ref 150–400)
RBC: 4.13 MIL/uL (ref 3.87–5.11)
RDW: 13.4 % (ref 11.5–15.5)
WBC: 8.5 10*3/uL (ref 4.0–10.5)

## 2013-11-08 LAB — LACTATE DEHYDROGENASE: LDH: 246 U/L (ref 94–250)

## 2013-11-08 LAB — URIC ACID: Uric Acid, Serum: 8.7 mg/dL — ABNORMAL HIGH (ref 2.4–7.0)

## 2013-11-08 SURGERY — Surgical Case
Anesthesia: Spinal

## 2013-11-08 MED ORDER — SIMETHICONE 80 MG PO CHEW
80.0000 mg | CHEWABLE_TABLET | ORAL | Status: DC
  Administered 2013-11-08 – 2013-11-10 (×3): 80 mg via ORAL
  Filled 2013-11-08 (×3): qty 1

## 2013-11-08 MED ORDER — PHENYLEPHRINE HCL 10 MG/ML IJ SOLN
INTRAMUSCULAR | Status: DC | PRN
  Administered 2013-11-08: 60 ug via INTRAVENOUS

## 2013-11-08 MED ORDER — MEASLES, MUMPS & RUBELLA VAC ~~LOC~~ INJ: 0.5000 mL | INJECTION | Freq: Once | SUBCUTANEOUS | Status: DC

## 2013-11-08 MED ORDER — ONDANSETRON HCL 4 MG/2ML IJ SOLN
INTRAMUSCULAR | Status: DC | PRN
  Administered 2013-11-08: 4 mg via INTRAVENOUS

## 2013-11-08 MED ORDER — ZOLPIDEM TARTRATE 5 MG PO TABS: 5.0000 mg | ORAL_TABLET | Freq: Every evening | ORAL | Status: DC | PRN

## 2013-11-08 MED ORDER — LABETALOL HCL 5 MG/ML IV SOLN: 10.0000 mg | INTRAVENOUS | Status: DC | PRN

## 2013-11-08 MED ORDER — KETOROLAC TROMETHAMINE 30 MG/ML IJ SOLN
INTRAMUSCULAR | Status: AC
  Administered 2013-11-08: 30 mg via INTRAVENOUS
  Filled 2013-11-08: qty 1

## 2013-11-08 MED ORDER — SCOPOLAMINE 1 MG/3DAYS TD PT72: 1.0000 | MEDICATED_PATCH | TRANSDERMAL | Status: DC

## 2013-11-08 MED ORDER — ONDANSETRON HCL 4 MG PO TABS
4.0000 mg | ORAL_TABLET | ORAL | Status: DC | PRN
Start: 2013-11-08 — End: 2013-11-11

## 2013-11-08 MED ORDER — MAGNESIUM SULFATE 40 G IN LACTATED RINGERS - SIMPLE: 2.0000 g/h | INTRAVENOUS | Status: DC

## 2013-11-08 MED ORDER — BUPIVACAINE HCL (PF) 0.25 % IJ SOLN
INTRAMUSCULAR | Status: DC | PRN
  Administered 2013-11-08: 19 mL

## 2013-11-08 MED ORDER — DIPHENHYDRAMINE HCL 50 MG/ML IJ SOLN: 25.0000 mg | INTRAMUSCULAR | Status: DC | PRN

## 2013-11-08 MED ORDER — SODIUM CHLORIDE 0.9 % IJ SOLN: 3.0000 mL | INTRAMUSCULAR | Status: DC | PRN

## 2013-11-08 MED ORDER — LACTATED RINGERS IV SOLN
INTRAVENOUS | Status: DC | PRN
  Administered 2013-11-08: 15:00:00 via INTRAVENOUS

## 2013-11-08 MED ORDER — CITRIC ACID-SODIUM CITRATE 334-500 MG/5ML PO SOLN
30.0000 mL | Freq: Once | ORAL | Status: AC
  Administered 2013-11-08: 30 mL via ORAL
  Filled 2013-11-08: qty 15

## 2013-11-08 MED ORDER — TETANUS-DIPHTH-ACELL PERTUSSIS 5-2.5-18.5 LF-MCG/0.5 IM SUSP
0.5000 mL | Freq: Once | INTRAMUSCULAR | Status: AC
  Administered 2013-11-09: 0.5 mL via INTRAMUSCULAR
  Filled 2013-11-08: qty 0.5

## 2013-11-08 MED ORDER — NALOXONE HCL 0.4 MG/ML IJ SOLN: 0.4000 mg | INTRAMUSCULAR | Status: DC | PRN

## 2013-11-08 MED ORDER — DIPHENHYDRAMINE HCL 25 MG PO CAPS: 25.0000 mg | ORAL_CAPSULE | Freq: Four times a day (QID) | ORAL | Status: DC | PRN

## 2013-11-08 MED ORDER — MEPERIDINE HCL 25 MG/ML IJ SOLN: 6.2500 mg | INTRAMUSCULAR | Status: DC | PRN

## 2013-11-08 MED ORDER — CEFAZOLIN SODIUM-DEXTROSE 2-3 GM-% IV SOLR
INTRAVENOUS | Status: DC | PRN
  Administered 2013-11-08: 2 g via INTRAVENOUS

## 2013-11-08 MED ORDER — MONTELUKAST SODIUM 10 MG PO TABS
10.0000 mg | ORAL_TABLET | Freq: Every day | ORAL | Status: DC
  Administered 2013-11-09 – 2013-11-10 (×2): 10 mg via ORAL
  Filled 2013-11-08 (×3): qty 1

## 2013-11-08 MED ORDER — OXYTOCIN 40 UNITS IN LACTATED RINGERS INFUSION - SIMPLE MED: 62.5000 mL/h | INTRAVENOUS | Status: AC

## 2013-11-08 MED ORDER — CEFAZOLIN SODIUM-DEXTROSE 2-3 GM-% IV SOLR
INTRAVENOUS | Status: AC
  Filled 2013-11-08: qty 50

## 2013-11-08 MED ORDER — FAMOTIDINE IN NACL 20-0.9 MG/50ML-% IV SOLN
20.0000 mg | Freq: Once | INTRAVENOUS | Status: AC
  Administered 2013-11-08 (×2): 20 mg via INTRAVENOUS
  Filled 2013-11-08: qty 50

## 2013-11-08 MED ORDER — BUPIVACAINE IN DEXTROSE 0.75-8.25 % IT SOLN
INTRATHECAL | Status: DC | PRN
  Administered 2013-11-08: 1.4 mL via INTRATHECAL

## 2013-11-08 MED ORDER — ALBUTEROL SULFATE (2.5 MG/3ML) 0.083% IN NEBU: 3.0000 mL | INHALATION_SOLUTION | Freq: Four times a day (QID) | RESPIRATORY_TRACT | Status: DC | PRN

## 2013-11-08 MED ORDER — 0.9 % SODIUM CHLORIDE (POUR BTL) OPTIME
TOPICAL | Status: DC | PRN
  Administered 2013-11-08: 1000 mL

## 2013-11-08 MED ORDER — DIPHENHYDRAMINE HCL 50 MG/ML IJ SOLN: 12.5000 mg | INTRAMUSCULAR | Status: DC | PRN

## 2013-11-08 MED ORDER — BUDESONIDE-FORMOTEROL FUMARATE 160-4.5 MCG/ACT IN AERO
2.0000 | INHALATION_SPRAY | Freq: Two times a day (BID) | RESPIRATORY_TRACT | Status: DC
  Administered 2013-11-08 – 2013-11-10 (×4): 2 via RESPIRATORY_TRACT
  Filled 2013-11-08: qty 6

## 2013-11-08 MED ORDER — MENTHOL 3 MG MT LOZG: 1.0000 | LOZENGE | OROMUCOSAL | Status: DC | PRN

## 2013-11-08 MED ORDER — IBUPROFEN 600 MG PO TABS
600.0000 mg | ORAL_TABLET | Freq: Four times a day (QID) | ORAL | Status: DC
  Administered 2013-11-08 – 2013-11-11 (×10): 600 mg via ORAL
  Filled 2013-11-08 (×10): qty 1

## 2013-11-08 MED ORDER — ONDANSETRON HCL 4 MG/2ML IJ SOLN: 4.0000 mg | Freq: Three times a day (TID) | INTRAMUSCULAR | Status: DC | PRN

## 2013-11-08 MED ORDER — LACTATED RINGERS IV SOLN
INTRAVENOUS | Status: DC
  Administered 2013-11-09 (×2): via INTRAVENOUS

## 2013-11-08 MED ORDER — FENTANYL CITRATE 0.05 MG/ML IJ SOLN
INTRAMUSCULAR | Status: AC
  Filled 2013-11-08: qty 2

## 2013-11-08 MED ORDER — SIMETHICONE 80 MG PO CHEW
80.0000 mg | CHEWABLE_TABLET | Freq: Three times a day (TID) | ORAL | Status: DC
  Administered 2013-11-09 – 2013-11-10 (×6): 80 mg via ORAL
  Filled 2013-11-08 (×6): qty 1

## 2013-11-08 MED ORDER — METHYLERGONOVINE MALEATE 0.2 MG/ML IJ SOLN: 0.2000 mg | INTRAMUSCULAR | Status: DC | PRN

## 2013-11-08 MED ORDER — NALBUPHINE HCL 10 MG/ML IJ SOLN
5.0000 mg | INTRAMUSCULAR | Status: DC | PRN
Start: 2013-11-08 — End: 2013-11-11

## 2013-11-08 MED ORDER — SIMETHICONE 80 MG PO CHEW: 80.0000 mg | CHEWABLE_TABLET | ORAL | Status: DC | PRN

## 2013-11-08 MED ORDER — MAGNESIUM SULFATE 40 G IN LACTATED RINGERS - SIMPLE
2.0000 g/h | INTRAVENOUS | Status: DC
  Administered 2013-11-08: 4 g/h via INTRAVENOUS
  Filled 2013-11-08 (×2): qty 500

## 2013-11-08 MED ORDER — ONDANSETRON HCL 4 MG/2ML IJ SOLN
INTRAMUSCULAR | Status: AC
  Filled 2013-11-08: qty 2

## 2013-11-08 MED ORDER — LACTATED RINGERS IV SOLN
INTRAVENOUS | Status: DC | PRN
  Administered 2013-11-08: 16:00:00 via INTRAVENOUS

## 2013-11-08 MED ORDER — FERROUS SULFATE 325 (65 FE) MG PO TABS
325.0000 mg | ORAL_TABLET | Freq: Two times a day (BID) | ORAL | Status: DC
  Administered 2013-11-09 – 2013-11-10 (×4): 325 mg via ORAL
  Filled 2013-11-08 (×4): qty 1

## 2013-11-08 MED ORDER — MAGNESIUM SULFATE BOLUS VIA INFUSION
4.0000 g | Freq: Once | INTRAVENOUS | Status: DC
  Filled 2013-11-08: qty 500

## 2013-11-08 MED ORDER — LACTATED RINGERS IV SOLN
INTRAVENOUS | Status: DC
  Administered 2013-11-08: 12:00:00 via INTRAVENOUS

## 2013-11-08 MED ORDER — PRENATAL MULTIVITAMIN CH
1.0000 | ORAL_TABLET | Freq: Every day | ORAL | Status: DC
  Administered 2013-11-09 – 2013-11-10 (×2): 1 via ORAL
  Filled 2013-11-08 (×2): qty 1

## 2013-11-08 MED ORDER — SCOPOLAMINE 1 MG/3DAYS TD PT72
MEDICATED_PATCH | TRANSDERMAL | Status: AC
  Filled 2013-11-08: qty 1

## 2013-11-08 MED ORDER — SENNOSIDES-DOCUSATE SODIUM 8.6-50 MG PO TABS
2.0000 | ORAL_TABLET | ORAL | Status: DC
  Administered 2013-11-08 – 2013-11-10 (×3): 2 via ORAL
  Filled 2013-11-08 (×3): qty 2

## 2013-11-08 MED ORDER — LANOLIN HYDROUS EX OINT: 1.0000 "application " | TOPICAL_OINTMENT | CUTANEOUS | Status: DC | PRN

## 2013-11-08 MED ORDER — WITCH HAZEL-GLYCERIN EX PADS: 1.0000 "application " | MEDICATED_PAD | CUTANEOUS | Status: DC | PRN

## 2013-11-08 MED ORDER — BUPIVACAINE HCL (PF) 0.25 % IJ SOLN
INTRAMUSCULAR | Status: AC
Start: 2013-11-08 — End: 2013-11-08
  Filled 2013-11-08: qty 30

## 2013-11-08 MED ORDER — KETOROLAC TROMETHAMINE 30 MG/ML IJ SOLN
30.0000 mg | Freq: Four times a day (QID) | INTRAMUSCULAR | Status: AC | PRN
  Administered 2013-11-08: 30 mg via INTRAVENOUS

## 2013-11-08 MED ORDER — NIFEDIPINE ER 30 MG PO TB24
30.0000 mg | ORAL_TABLET | Freq: Every day | ORAL | Status: DC
  Administered 2013-11-09: 30 mg via ORAL
  Filled 2013-11-08 (×2): qty 1

## 2013-11-08 MED ORDER — FENTANYL CITRATE 0.05 MG/ML IJ SOLN
INTRAMUSCULAR | Status: DC | PRN
  Administered 2013-11-08: 25 ug via INTRATHECAL

## 2013-11-08 MED ORDER — MORPHINE SULFATE 0.5 MG/ML IJ SOLN
INTRAMUSCULAR | Status: AC
  Filled 2013-11-08: qty 10

## 2013-11-08 MED ORDER — METHYLERGONOVINE MALEATE 0.2 MG PO TABS: 0.2000 mg | ORAL_TABLET | ORAL | Status: DC | PRN

## 2013-11-08 MED ORDER — DIPHENHYDRAMINE HCL 25 MG PO CAPS: 25.0000 mg | ORAL_CAPSULE | ORAL | Status: DC | PRN

## 2013-11-08 MED ORDER — LACTATED RINGERS IV SOLN
40.0000 [IU] | INTRAVENOUS | Status: DC | PRN
  Administered 2013-11-08: 40 [IU] via INTRAVENOUS

## 2013-11-08 MED ORDER — NALOXONE HCL 1 MG/ML IJ SOLN: 1.0000 ug/kg/h | INTRAVENOUS | Status: DC | PRN

## 2013-11-08 MED ORDER — ONDANSETRON HCL 4 MG/2ML IJ SOLN: 4.0000 mg | INTRAMUSCULAR | Status: DC | PRN

## 2013-11-08 MED ORDER — NALBUPHINE HCL 10 MG/ML IJ SOLN: 5.0000 mg | INTRAMUSCULAR | Status: DC | PRN

## 2013-11-08 MED ORDER — KETOROLAC TROMETHAMINE 30 MG/ML IJ SOLN: 30.0000 mg | Freq: Four times a day (QID) | INTRAMUSCULAR | Status: AC | PRN

## 2013-11-08 MED ORDER — LABETALOL HCL 5 MG/ML IV SOLN
10.0000 mg | INTRAVENOUS | Status: DC | PRN
  Administered 2013-11-08 (×2): 10 mg via INTRAVENOUS
  Filled 2013-11-08: qty 4

## 2013-11-08 MED ORDER — METOCLOPRAMIDE HCL 5 MG/ML IJ SOLN
10.0000 mg | Freq: Three times a day (TID) | INTRAMUSCULAR | Status: DC | PRN
  Administered 2013-11-08: 10 mg via INTRAVENOUS

## 2013-11-08 MED ORDER — MORPHINE SULFATE (PF) 0.5 MG/ML IJ SOLN
INTRAMUSCULAR | Status: DC | PRN
  Administered 2013-11-08: .15 mg via INTRATHECAL

## 2013-11-08 MED ORDER — OXYTOCIN 10 UNIT/ML IJ SOLN
INTRAMUSCULAR | Status: AC
  Filled 2013-11-08: qty 4

## 2013-11-08 MED ORDER — METOCLOPRAMIDE HCL 5 MG/ML IJ SOLN
INTRAMUSCULAR | Status: AC
  Administered 2013-11-08: 10 mg via INTRAVENOUS
  Filled 2013-11-08: qty 2

## 2013-11-08 MED ORDER — DIBUCAINE 1 % RE OINT: 1.0000 "application " | TOPICAL_OINTMENT | RECTAL | Status: DC | PRN

## 2013-11-08 MED ORDER — FENTANYL CITRATE 0.05 MG/ML IJ SOLN: 25.0000 ug | INTRAMUSCULAR | Status: DC | PRN

## 2013-11-08 MED ORDER — OXYCODONE-ACETAMINOPHEN 5-325 MG PO TABS
1.0000 | ORAL_TABLET | ORAL | Status: DC | PRN
  Administered 2013-11-09: 1 via ORAL
  Filled 2013-11-08: qty 1

## 2013-11-08 MED ORDER — MAGNESIUM SULFATE 4000MG/100ML IJ SOLN: 4.0000 g | Freq: Once | INTRAMUSCULAR | Status: DC

## 2013-11-08 MED ORDER — PHENYLEPHRINE HCL 10 MG/ML IJ SOLN
INTRAMUSCULAR | Status: AC
  Filled 2013-11-08: qty 1

## 2013-11-08 SURGICAL SUPPLY — 41 items
APL SKNCLS STERI-STRIP NONHPOA (GAUZE/BANDAGES/DRESSINGS) ×1
BENZOIN TINCTURE PRP APPL 2/3 (GAUZE/BANDAGES/DRESSINGS) ×3 IMPLANT
BOOTIES KNEE HIGH SLOAN (MISCELLANEOUS) ×6 IMPLANT
CLAMP CORD UMBIL (MISCELLANEOUS) IMPLANT
CLOSURE WOUND 1/2 X4 (GAUZE/BANDAGES/DRESSINGS) ×1
CLOTH BEACON ORANGE TIMEOUT ST (SAFETY) ×3 IMPLANT
DRAIN JACKSON PRT FLT 10 (DRAIN) IMPLANT
DRAPE LG THREE QUARTER DISP (DRAPES) IMPLANT
DRSG OPSITE POSTOP 4X10 (GAUZE/BANDAGES/DRESSINGS) ×3 IMPLANT
DURAPREP 26ML APPLICATOR (WOUND CARE) ×3 IMPLANT
ELECT REM PT RETURN 9FT ADLT (ELECTROSURGICAL) ×3
ELECTRODE REM PT RTRN 9FT ADLT (ELECTROSURGICAL) ×1 IMPLANT
EVACUATOR SILICONE 100CC (DRAIN) IMPLANT
EXTRACTOR VACUUM M CUP 4 TUBE (SUCTIONS) IMPLANT
EXTRACTOR VACUUM M CUP 4' TUBE (SUCTIONS)
GLOVE BIOGEL PI IND STRL 7.0 (GLOVE) ×1 IMPLANT
GLOVE BIOGEL PI INDICATOR 7.0 (GLOVE) ×2
GLOVE ECLIPSE 6.5 STRL STRAW (GLOVE) ×3 IMPLANT
GOWN STRL REUS W/TWL LRG LVL3 (GOWN DISPOSABLE) ×6 IMPLANT
KIT ABG SYR 3ML LUER SLIP (SYRINGE) IMPLANT
NEEDLE HYPO 22GX1.5 SAFETY (NEEDLE) ×3 IMPLANT
NEEDLE HYPO 25X5/8 SAFETYGLIDE (NEEDLE) ×3 IMPLANT
NS IRRIG 1000ML POUR BTL (IV SOLUTION) ×6 IMPLANT
PACK C SECTION WH (CUSTOM PROCEDURE TRAY) ×3 IMPLANT
PAD ABD 7.5X8 STRL (GAUZE/BANDAGES/DRESSINGS) ×2 IMPLANT
PAD OB MATERNITY 4.3X12.25 (PERSONAL CARE ITEMS) ×3 IMPLANT
RTRCTR C-SECT PINK 25CM LRG (MISCELLANEOUS) ×3 IMPLANT
SPONGE GAUZE 4X4 12PLY STER LF (GAUZE/BANDAGES/DRESSINGS) ×3 IMPLANT
STRIP CLOSURE SKIN 1/2X4 (GAUZE/BANDAGES/DRESSINGS) ×2 IMPLANT
SUT CHROMIC GUT AB #0 18 (SUTURE) IMPLANT
SUT MNCRL AB 3-0 PS2 27 (SUTURE) ×3 IMPLANT
SUT SILK 2 0 FSL 18 (SUTURE) IMPLANT
SUT VIC AB 0 CTX 36 (SUTURE) ×6
SUT VIC AB 0 CTX36XBRD ANBCTRL (SUTURE) ×2 IMPLANT
SUT VIC AB 1 CT1 36 (SUTURE) ×6 IMPLANT
SUT VIC AB 3-0 SH 27 (SUTURE) ×3
SUT VIC AB 3-0 SH 27X BRD (SUTURE) IMPLANT
SYR 20CC LL (SYRINGE) ×3 IMPLANT
TOWEL OR 17X24 6PK STRL BLUE (TOWEL DISPOSABLE) ×3 IMPLANT
TRAY FOLEY CATH 14FR (SET/KITS/TRAYS/PACK) ×3 IMPLANT
WATER STERILE IRR 1000ML POUR (IV SOLUTION) ×3 IMPLANT

## 2013-11-08 NOTE — Anesthesia Preprocedure Evaluation (Deleted)
Anesthesia Evaluation  Patient identified by MRN, date of birth, ID band Patient awake    Reviewed: Allergy & Precautions, H&P , NPO status , Patient's Chart, lab work & pertinent test results  History of Anesthesia Complications (+) PONV and history of anesthetic complications  Airway Mallampati: III TM Distance: >3 FB Neck ROM: Full    Dental no notable dental hx. (+) Teeth Intact   Pulmonary asthma ,  breath sounds clear to auscultation  Pulmonary exam normal       Cardiovascular hypertension, Pt. on medications Rhythm:Regular Rate:Normal  Pre eclampsia   Neuro/Psych negative neurological ROS  negative psych ROS   GI/Hepatic negative GI ROS, Neg liver ROS,   Endo/Other  diabetes, Well Controlled, GestationalObesity  Renal/GU   negative genitourinary   Musculoskeletal negative musculoskeletal ROS (+)   Abdominal (+) + obese,   Peds  Hematology  (+) anemia ,   Anesthesia Other Findings   Reproductive/Obstetrics (+) Pregnancy Preterm 35 5/7 weeks Worsening pre eclampsia Previous Myomectomy                           Anesthesia Physical Anesthesia Plan  ASA: III and emergent  Anesthesia Plan: Spinal   Post-op Pain Management:    Induction:   Airway Management Planned: Natural Airway  Additional Equipment:   Intra-op Plan:   Post-operative Plan:   Informed Consent: I have reviewed the patients History and Physical, chart, labs and discussed the procedure including the risks, benefits and alternatives for the proposed anesthesia with the patient or authorized representative who has indicated his/her understanding and acceptance.   Dental advisory given  Plan Discussed with: Anesthesiologist, CRNA and Surgeon  Anesthesia Plan Comments:         Anesthesia Quick Evaluation

## 2013-11-08 NOTE — MAU Provider Note (Signed)
History   38 yo G4P1111 at 35 6/7 weeks presented from office for BP evaluation due to elevated BP in office, 140-150/90-112.  Denies HA, visual sx, or epigastric pain. On Procardia 30 XL daily during pregnancy, with last dose at 6:30am.  Has been on 17P due to hx of 22 week loss with PPROM.  Last dose 17p last week.  GBS pending from today in office.  Patient's last meal was at 8:30am, with omelet and muffin.  Patient Active Problem List   Diagnosis Date Noted  . Chronic hypertension with superimposed preeclampsia 11/08/2013  . Previous cesarean delivery, antepartum condition or complication 81/82/9937  . Pregnancy w/ hx of uterine myomectomy--robotic 11/08/2013  . H/O preterm delivery, currently pregnant--22 weeks,, PPROM. 11/08/2013  . Unspecified asthma 08/29/2012  . Fibroid 08/29/2012  . H/O unilateral salpingectomy 08/29/2012  . Essential hypertension, benign 08/29/2012    Chief Complaint  Patient presents with  . Hypertension   HPI: See above  OB History   Grav Para Term Preterm Abortions TAB SAB Ect Mult Living   4 2 1 1 1   1  1       Past Medical History  Diagnosis Date  . Hx of ectopic pregnancy 09/24/2004    Right  . Abnormal Pap smear 1999    LEEP  . Monilial vaginitis 2005  . Hx gestational diabetes   . Breast mass, right 2006  . First trimester bleeding 2006    ECTOPIC  . BV (bacterial vaginosis) 2006  . Elevated BP 2008  . H/O hemorrhoids 2009  . H/O: menorrhagia 03/26/11  . Sickle-cell trait   . Fibroid 04/15/11  . Allergy   . Asthma   . Hypertension   . Diabetes mellitus without complication 1696    gestational  . Anemia     has rx for iron but doesnt take due to constipation  . PONV (postoperative nausea and vomiting)   . Gestational diabetes   . Infection     UTI  . Ovarian cyst   . Vaginal Pap smear, abnormal     Past Surgical History  Procedure Laterality Date  . Wisdom tooth extraction    . Cesarean section    . Leep    .  Robot assisted myomectomy N/A 09/07/2012    Procedure: ROBOTIC ASSISTED MYOMECTOMY;  Surgeon: Alwyn Pea, MD;  Location: [facility];  Service: Gynecology;  Laterality: N/A;  . Laparotomy      for removal of ectopic preg    Family History  Problem Relation Age of Onset  . Hypertension Mother   . Arthritis Mother   . Graves' disease Mother   . Diabetes Mother   . Diabetes Maternal Grandmother     type 2   . Heart failure Maternal Grandmother   . Hearing loss Paternal Grandmother     with age    History  Substance Use Topics  . Smoking status: Never Smoker   . Smokeless tobacco: Never Used  . Alcohol Use: Yes     Comment: rare and not with preg    Allergies: No Known Allergies  Prescriptions prior to admission  Medication Sig Dispense Refill  . acetaminophen (TYLENOL) 500 MG tablet Take 500 mg by mouth every 6 (six) hours as needed for mild pain.      Marland Kitchen albuterol (PROVENTIL HFA;VENTOLIN HFA) 108 (90 BASE) MCG/ACT inhaler Inhale 2 puffs into the lungs every 6 (six) hours as needed for wheezing.      . budesonide-formoterol (  SYMBICORT) 160-4.5 MCG/ACT inhaler Inhale 2 puffs into the lungs 2 (two) times daily.      . montelukast (SINGULAIR) 10 MG tablet Take 10 mg by mouth at bedtime.      Marland Kitchen NIFEdipine (PROCARDIA-XL/ADALAT-CC/NIFEDICAL-XL) 30 MG 24 hr tablet Take 30 mg by mouth daily.        ROS:  Denies any sx. Physical Exam   Blood pressure 155/112, pulse 84, temperature 98.2 F (36.8 C), temperature source Oral, resp. rate 20.  Filed Vitals:   11/08/13 1204 11/08/13 1219 11/08/13 1249 11/08/13 1304  BP: 159/104 158/102 139/88 155/112  Pulse: 88 80 90 84  Temp:      TempSrc:      Resp:       BP range 139-167/88-116 in MAU Received 2 doses IV Labetalol in MAU 1312 and 1349, with no benefit in BP.   Physical Exam Chest clear Heart RRR without murmur Abd gravid, NT Pelvic--deferred Ext--DTR 1+, no clonus, 1+ edema  FHR Category 1 UCs none  Results for orders  placed during the hospital encounter of 11/08/13 (from the past 24 hour(s))  PROTEIN / CREATININE RATIO, URINE     Status: Abnormal   Collection Time    11/08/13 11:17 AM      Result Value Ref Range   Creatinine, Urine 102.57     Total Protein, Urine 74.1     PROTEIN CREATININE RATIO 0.72 (*) 0.00 - 0.15  CBC     Status: None   Collection Time    11/08/13 12:25 PM      Result Value Ref Range   WBC 8.5  4.0 - 10.5 K/uL   RBC 4.13  3.87 - 5.11 MIL/uL   Hemoglobin 12.8  12.0 - 15.0 g/dL   HCT 37.1  36.0 - 46.0 %   MCV 89.8  78.0 - 100.0 fL   MCH 31.0  26.0 - 34.0 pg   MCHC 34.5  30.0 - 36.0 g/dL   RDW 13.4  11.5 - 15.5 %   Platelets 257  150 - 400 K/uL  COMPREHENSIVE METABOLIC PANEL     Status: Abnormal   Collection Time    11/08/13 12:25 PM      Result Value Ref Range   Sodium 136 (*) 137 - 147 mEq/L   Potassium 4.9  3.7 - 5.3 mEq/L   Chloride 102  96 - 112 mEq/L   CO2 20  19 - 32 mEq/L   Glucose, Bld 61 (*) 70 - 99 mg/dL   BUN 14  6 - 23 mg/dL   Creatinine, Ser 0.69  0.50 - 1.10 mg/dL   Calcium 10.1  8.4 - 10.5 mg/dL   Total Protein 6.5  6.0 - 8.3 g/dL   Albumin 2.8 (*) 3.5 - 5.2 g/dL   AST 25  0 - 37 U/L   ALT 19  0 - 35 U/L   Alkaline Phosphatase 103  39 - 117 U/L   Total Bilirubin <0.2 (*) 0.3 - 1.2 mg/dL   GFR calc non Af Amer >90  >90 mL/min   GFR calc Af Amer >90  >90 mL/min  LACTATE DEHYDROGENASE     Status: None   Collection Time    11/08/13 12:25 PM      Result Value Ref Range   LDH 246  94 - 250 U/L  URIC ACID     Status: Abnormal   Collection Time    11/08/13 12:25 PM      Result Value Ref  Range   Uric Acid, Serum 8.7 (*) 2.4 - 7.0 mg/dL    Prenatal Transfer Tool  Maternal Diabetes: No Genetic Screening: Declined Maternal Ultrasounds/Referrals: Normal Fetal Ultrasounds or other Referrals:  None Maternal Substance Abuse:  No Significant Maternal Medications:  Meds include: Other: Procardia 30 mg XL; Significant Maternal Lab Results: Lab values  include: Other: GBS pending from today in office.     ED Course  IUP at 35 6/7 weeks Chronic hypertension with superimposed pre-eclampsia GBS pending from today. Previous C/s and myomectomy--planning repeat C/S  Plan: Admitted to [facility] per consult with Dr. Shanon Ace proceed with repeat C/S. Routine pre-op orders R&B of C/S reviewed with patient and husband, including bleeding, infection, and damage to other organs.  Patient and husband seem to understand these risks and wish to proceed. Plan Magnesium sulfate for 24 hours post-delivery for seizure prophylaxis.   Donnel Saxon CNM, MSN 11/08/2013 2:42 PM

## 2013-11-08 NOTE — MAU Note (Signed)
Urine in Lab 

## 2013-11-08 NOTE — Transfer of Care (Signed)
Immediate Anesthesia Transfer of Care Note  Patient: Briana Ford  Procedure(s) Performed: Procedure(s): REPEAT CESAREAN SECTION (N/A)  Patient Location: PACU  Anesthesia Type:Spinal  Level of Consciousness: awake, alert  and oriented  Airway & Oxygen Therapy: Patient Spontanous Breathing  Post-op Assessment: Report given to PACU RN and Post -op Vital signs reviewed and stable  Post vital signs: Reviewed and stable  Complications: No apparent anesthesia complications

## 2013-11-08 NOTE — Anesthesia Procedure Notes (Signed)
Spinal  Patient location during procedure: OR Start time: 11/08/2013 2:55 PM Staffing Anesthesiologist: Oren Barella A. Performed by: anesthesiologist  Preanesthetic Checklist Completed: patient identified, site marked, surgical consent, pre-op evaluation, timeout performed, IV checked, risks and benefits discussed and monitors and equipment checked Spinal Block Patient position: sitting Prep: site prepped and draped and DuraPrep Patient monitoring: heart rate, cardiac monitor, continuous pulse ox and blood pressure Approach: midline Location: L3-4 Injection technique: single-shot Needle Needle type: Sprotte  Needle gauge: 24 G Needle length: 9 cm Needle insertion depth: 6 cm Assessment Sensory level: T4 Additional Notes Patient tolerated procedure well. Adequate sensory level.

## 2013-11-08 NOTE — MAU Note (Signed)
Hx of CHTN, has been on meds for a few years. Reports slight HA, doesn't know if it because she is worried now, no other problems.

## 2013-11-08 NOTE — Anesthesia Preprocedure Evaluation (Signed)
Anesthesia Evaluation  Patient identified by MRN, date of birth, ID band Patient awake    Reviewed: Allergy & Precautions, H&P , NPO status , Patient's Chart, lab work & pertinent test results  History of Anesthesia Complications (+) PONV and history of anesthetic complications  Airway Mallampati: III TM Distance: >3 FB Neck ROM: Full    Dental no notable dental hx. (+) Teeth Intact   Pulmonary asthma ,  breath sounds clear to auscultation  Pulmonary exam normal       Cardiovascular hypertension, Pt. on medications Rhythm:Regular Rate:Normal  Pre eclampsia   Neuro/Psych negative neurological ROS  negative psych ROS   GI/Hepatic negative GI ROS, Neg liver ROS,   Endo/Other  diabetes, Well Controlled, GestationalObesity  Renal/GU   negative genitourinary   Musculoskeletal negative musculoskeletal ROS (+)   Abdominal (+) + obese,   Peds  Hematology  (+) anemia ,   Anesthesia Other Findings   Reproductive/Obstetrics (+) Pregnancy Preterm 35 5/7 weeks Worsening pre eclampsia Previous Myomectomy                           Anesthesia Physical Anesthesia Plan  ASA: III and emergent  Anesthesia Plan: Spinal   Post-op Pain Management:    Induction:   Airway Management Planned: Natural Airway  Additional Equipment:   Intra-op Plan:   Post-operative Plan:   Informed Consent: I have reviewed the patients History and Physical, chart, labs and discussed the procedure including the risks, benefits and alternatives for the proposed anesthesia with the patient or authorized representative who has indicated his/her understanding and acceptance.   Dental advisory given  Plan Discussed with: Anesthesiologist, CRNA and Surgeon  Anesthesia Plan Comments:         Anesthesia Quick Evaluation   

## 2013-11-08 NOTE — Op Note (Signed)
Preoperative diagnosis: Intrauterine pregnancy at 35 weeks and 6 days , previous cesarean section, myomectomy and severe pre-eclampsia  Post operative diagnosis: Same  Anesthesia: Spinal  Anesthesiologist: Dr. Lanelle Bal  Procedure: Repeat low transverse cesarean section  Surgeon: Dr. Katharine Look Trenell Moxey  Assistant: Donnel Saxon CNM  Estimated blood loss: 600 cc  Procedure:  After being informed of the planned procedure and possible complications including bleeding, infection, injury to other organs, informed consent is obtained. The patient is taken to OR #2 and given spinal anesthesia without complication. She is placed in the dorsal decubitus position with the pelvis tilted to the left. She is then prepped and draped in a sterile fashion. A Foley catheter is inserted in her bladder.  After assessing adequate level of anesthesia, we infiltrate the suprapubic area with 20 cc of Marcaine 0.25 and perform a Pfannenstiel incision which is brought down sharply to the fascia. The fascia is entered in a low transverse fashion. Linea alba is dissected. Peritoneum is entered in a midline fashion. An Alexis retractor is easily positioned. There are no significant adhesions and no fibroid is identified.  The myometrium is then entered in a low transverse fashion, 2 cm above the vesico-uterine junction ; first with knife and then extended bluntly. Amniotic fluid is clear. We assist the birth of a female  infant in vertex presentation. Mouth and nose are suctioned. The baby is delivered. The cord is clamped and sectioned. The baby is given to the neonatologist present in the room.  10 cc of blood is drawn from the umbilical vein.The placenta is allowed to deliver spontaneously. It is complete and the cord has 3 vessels. Uterine revision is negative.  We proceed with closure of the myometrium in 2 layers: First with a running locked suture of 0 Vicryl, then with a Lembert suture of 0 Vicryl imbricating  the first one. Hemostasis is completed with cauterization on peritoneal edges.  Both paracolic gutters are cleaned. Left  tube and both ovaries are assessed and normal. The right tube is absent surgically.The pelvis is profusely irrigated with warm saline to confirm a satisfactory hemostasis.  Retractors and sponges are removed. Under fascia hemostasis is completed with cauterization. The fascia is then closed with 2 running sutures of 0 Vicryl meeting midline. The wound is irrigated with warm saline and hemostasis is completed with cauterization. The skin is closed with a subcuticular suture of 3-0 Monocryl and Steri-Strips.  Instrument and sponge count is complete x2. Estimated blood loss is 600 cc.  The procedure is well tolerated by the patient who is taken to recovery room in a well and stable condition.  female baby named [name] was born at 15:29 and received an Apgar of 9  at 1 minute and 9 at 5 minutes.    Specimen: Placenta sent to pathology   Dede Query Herald Vallin MD 5/6/20154:03 PM

## 2013-11-08 NOTE — Anesthesia Postprocedure Evaluation (Signed)
  Anesthesia Post-op Note  Patient: Briana Ford  Procedure(s) Performed: Procedure(s): REPEAT CESAREAN SECTION (N/A)  Patient Location: PACU  Anesthesia Type:Spinal  Level of Consciousness: awake, alert  and oriented  Airway and Oxygen Therapy: Patient Spontanous Breathing  Post-op Pain: none  Post-op Assessment: Post-op Vital signs reviewed, Patient's Cardiovascular Status Stable, Respiratory Function Stable, Patent Airway, No signs of Nausea or vomiting, Pain level controlled, No headache and No backache  Post-op Vital Signs: Reviewed and stable  Last Vitals:  Filed Vitals:   11/08/13 1645  BP: 140/93  Pulse: 71  Temp:   Resp: 20    Complications: No apparent anesthesia complications

## 2013-11-09 ENCOUNTER — Encounter (HOSPITAL_COMMUNITY): Payer: Self-pay | Admitting: Obstetrics and Gynecology

## 2013-11-09 LAB — COMPREHENSIVE METABOLIC PANEL
ALT: 17 U/L (ref 0–35)
AST: 23 U/L (ref 0–37)
Albumin: 2.3 g/dL — ABNORMAL LOW (ref 3.5–5.2)
Alkaline Phosphatase: 85 U/L (ref 39–117)
BUN: 9 mg/dL (ref 6–23)
CALCIUM: 8.3 mg/dL — AB (ref 8.4–10.5)
CO2: 21 meq/L (ref 19–32)
CREATININE: 0.65 mg/dL (ref 0.50–1.10)
Chloride: 100 mEq/L (ref 96–112)
GFR calc non Af Amer: >90 mL/min (ref >90)
Glucose, Bld: 80 mg/dL (ref 70–99)
Potassium: 4.4 mEq/L (ref 3.7–5.3)
Sodium: 135 mEq/L — ABNORMAL LOW (ref 137–147)
TOTAL PROTEIN: 5.4 g/dL — AB (ref 6.0–8.3)
Total Bilirubin: 0.2 mg/dL — ABNORMAL LOW (ref 0.3–1.2)

## 2013-11-09 LAB — CBC
HEMATOCRIT: 33.3 % — AB (ref 36.0–46.0)
Hemoglobin: 11.3 g/dL — ABNORMAL LOW (ref 12.0–15.0)
MCH: 30.5 pg (ref 26.0–34.0)
MCHC: 33.9 g/dL (ref 30.0–36.0)
MCV: 89.8 fL (ref 78.0–100.0)
Platelets: 212 10*3/uL (ref 150–400)
RBC: 3.71 MIL/uL — ABNORMAL LOW (ref 3.87–5.11)
RDW: 13.4 % (ref 11.5–15.5)
WBC: 11.3 10*3/uL — ABNORMAL HIGH (ref 4.0–10.5)

## 2013-11-09 LAB — RPR

## 2013-11-09 MED ORDER — NIFEDIPINE ER 60 MG PO TB24
60.0000 mg | ORAL_TABLET | Freq: Every day | ORAL | Status: DC
  Filled 2013-11-09: qty 1

## 2013-11-09 MED ORDER — NIFEDIPINE ER 30 MG PO TB24
30.0000 mg | ORAL_TABLET | Freq: Every day | ORAL | Status: AC
  Administered 2013-11-09: 30 mg via ORAL
  Filled 2013-11-09: qty 1

## 2013-11-09 NOTE — Addendum Note (Signed)
Addendum created 11/09/13 0749 by Asher Muir, CRNA   Modules edited: Notes Section   Notes Section:  File: [id number]

## 2013-11-09 NOTE — Progress Notes (Addendum)
Subjective: Postpartum Day 1: Cesarean Delivery due to [facility] with superimposed Pre-E Patient up ad lib, reports no syncope or dizziness.  Denies HA, vision changes, or right epigastric pain. Reports some gas but no flatus yet, denies nausea - discussed getting up out of bed today, will continue to monitor closely.  Pain is well managed. Feeding:  Breast/pumping Contraceptive plan:  unknown  Objective: Vital signs in last 24 hours: Temp:  [97.8 F (36.6 C)-98.6 F (37 C)] 98.5 F (36.9 C) (05/07 0800) Pulse Rate:  [69-105] 96 (05/07 0902) Resp:  [16-24] 18 (05/07 0800) BP: (127-167)/(78-116) 138/96 mmHg (05/07 1000) SpO2:  [95 %-99 %] 98 % (05/07 0902) Weight:  [205 lb 9.6 oz (93.26 kg)-210 lb 6.4 oz (95.437 kg)] 210 lb 6.4 oz (95.437 kg) (05/07 0500)   Physical Exam:  General: alert, cooperative and no distress Lochia: appropriate Uterine Fundus: firm Incision: healing well DVT Evaluation: No evidence of DVT seen on physical exam. Negative Homan's sign. JP drain:   N/a   Recent Labs  11/08/13 1225 11/09/13 0525  HGB 12.8 11.3*  HCT 37.1 33.3*  WBC 8.5 11.3*    Assessment/Plan: Status post Cesarean section day 1. Asymptomatic anemia - Fe ordered Good UOP Stable BPs Labs WNL Doing well postoperatively.   Continue MgSO4 until this afternoon Procardia restarted today Plan to d/c MgSO4 this afternoon and transfer to [facility]   [place], [place] 11/09/2013, 10:21 AM  Seen and agreed Doing very well but has not diuresed yet. Will follow closely before d/c MgSO4 May consider d/c home in am

## 2013-11-09 NOTE — Anesthesia Postprocedure Evaluation (Signed)
Anesthesia Post Note  Patient: Briana Ford  Procedure(s) Performed: Procedure(s) (LRB): REPEAT CESAREAN SECTION (N/A)  Anesthesia type: Spinal  Patient location: Mother/Baby  Post pain: Pain level controlled  Post assessment: Post-op Vital signs reviewed  Last Vitals:  Filed Vitals:   11/09/13 0700  BP: 127/78  Pulse: 91  Temp:   Resp:     Post vital signs: Reviewed  Level of consciousness: awake  Complications: No apparent anesthesia complications

## 2013-11-09 NOTE — Lactation Note (Signed)
This note was copied from the chart of [place]. Lactation Consultation Note     Initial consult with this mom and baby, now 10 hours old,and 36 weeks corrected gestation. Baby is big at 5 lbs 13 oz, but is sleepy , as expected for a LPT baby. i assisted mom with latching baby in cross cradle hold. He latched deeply with a few strong suckles, and fell asleep. I had mom pump in premie setting, while dad fed the baby formula, by bottle.  Mom and dad have the feeding amounts information, and mom advised to pump every 3 hours, as well as feed baby every 3 hours. After pumping, mom was able to express tiny drops os colsotrum from each breast. I advised mom to apply EBM to her nipples, and she alread is doing lots of skin to skin. The baby was being circumcised today, and I told mom he will [probably be extra sleepy for the next 6 hours, after this. Mom knows to call for questions/concerns.   Patient Name: Briana Ford Date: 11/09/2013 Reason for consult: Initial assessment;Infant < 6lbs;Late preterm infant   Maternal Data Formula Feeding for Exclusion: Yes Reason for exclusion: Admission to Intensive Care Unit (ICU) post-partum Infant to breast within first hour of birth: Yes Has patient been taught Hand Expression?: Yes Does the patient have breastfeeding experience prior to this delivery?: Yes  Feeding Feeding Type: Formula Length of feed: 3 min  LATCH Score/Interventions Latch: Repeated attempts needed to sustain latch, nipple held in mouth throughout feeding, stimulation needed to elicit sucking reflex. Intervention(s): Skin to skin;Teach feeding cues;Waking techniques Intervention(s): Adjust position;Assist with latch;Breast compression  Audible Swallowing: None Intervention(s): Hand expression  Type of Nipple: Everted at rest and after stimulation  Comfort (Breast/Nipple): Soft / non-tender     Hold (Positioning): Assistance needed to correctly position  infant at breast and maintain latch. Intervention(s): Breastfeeding basics reviewed;Support Pillows;Position options;Skin to skin  LATCH Score: 6  Lactation Tools Discussed/Used Tools: Pump Breast pump type: Double-Electric Breast Pump Pump Review: Setup, frequency, and cleaning;Milk Storage;Other (comment) (hand expression and premie setting) Initiated by:: bedsie rn  Date initiated:: 11/09/13   Consult Status Consult Status: Follow-up Date: 11/10/13 Follow-up type: In-patient    Tonna Corner 11/09/2013, 1:45 PM

## 2013-11-10 MED ORDER — NIFEDIPINE ER 60 MG PO TB24
60.0000 mg | ORAL_TABLET | Freq: Once | ORAL | Status: AC
  Administered 2013-11-10: 60 mg via ORAL
  Filled 2013-11-10: qty 1

## 2013-11-10 NOTE — Progress Notes (Signed)
Subjective: Postpartum Day 2: Cesarean Delivery due to severe pre-eclampsia, previous c-section and myomectomy Patient up ad lib, reports no syncope or dizziness. Feeding: Breast Contraceptive plan:  Undecided  Objective: Vital signs in last 24 hours: Temp:  [98.3 F (36.8 C)-98.8 F (37.1 C)] 98.3 F (36.8 C) (05/08 1246) Pulse Rate:  [88-109] 100 (05/08 1246) Resp:  [18-20] 19 (05/08 1246) BP: (119-151)/(80-94) 143/88 mmHg (05/08 1246) SpO2:  [96 %-99 %] 97 % (05/08 1246) Weight:  [204 lb 1.6 oz (92.579 kg)] 204 lb 1.6 oz (92.579 kg) (05/08 0606)  Procardia 60 mg XL given at 0745 from previous 30 mg XL Filed Vitals:   11/10/13 0606 11/10/13 0717 11/10/13 0905 11/10/13 1246  BP: 129/81  134/90 143/88  Pulse: 109  93 100  Temp:  98.8 F (37.1 C) 98.7 F (37.1 C) 98.3 F (36.8 C)  TempSrc:  Oral Oral Oral  Resp: 18 18 20 19   Height:      Weight: 204 lb 1.6 oz (92.579 kg)     SpO2: 97%  96% 97%    Physical Exam:  General: alert Lochia: appropriate Uterine Fundus: firm Abdomen:  WNL Incision: Initial honeycomb dressing changed by RN due to old drainage. New Honeycomb dressing applied by RN; c/d/i DVT Evaluation: No evidence of DVT seen on physical exam. Negative Homan's sign. Adequate diuresis noted by output   Recent Labs  11/08/13 1225 11/09/13 0525  HGB 12.8 11.3*  HCT 37.1 33.3*  WBC 8.5 11.3*    Assessment/Plan: Status post Cesarean section day 2 CHTN w/ superimposed preeclampsia Doing well postoperatively.  Continue current care. Plan for discharge tomorrow    [place], [place] 11/10/2013, 2:23 PM

## 2013-11-11 MED ORDER — FERROUS SULFATE 325 (65 FE) MG PO TABS: 325.0000 mg | ORAL_TABLET | Freq: Every day | ORAL | Status: AC

## 2013-11-11 MED ORDER — NIFEDIPINE ER 30 MG PO TB24
30.0000 mg | ORAL_TABLET | Freq: Once | ORAL | Status: AC
  Administered 2013-11-11: 30 mg via ORAL
  Filled 2013-11-11: qty 1

## 2013-11-11 MED ORDER — HYDROCODONE-IBUPROFEN 7.5-200 MG PO TABS: 1.0000 | ORAL_TABLET | Freq: Three times a day (TID) | ORAL | Status: AC | PRN

## 2013-11-11 MED ORDER — IBUPROFEN 600 MG PO TABS: 600.0000 mg | ORAL_TABLET | Freq: Four times a day (QID) | ORAL | Status: AC

## 2013-11-11 NOTE — Discharge Instructions (Signed)
Postpartum Depression and Baby Blues °The postpartum period begins right after the birth of a baby. During this time, there is often a great amount of joy and excitement. It is also a time of considerable changes in the life of the parent(s). Regardless of how many times a mother gives birth, each child brings new challenges and dynamics to the family. It is not unusual to have feelings of excitement accompanied by confusing shifts in moods, emotions, and thoughts. All mothers are at risk of developing postpartum depression or the "baby blues." These mood changes can occur right after giving birth, or they may occur many months after giving birth. The baby blues or postpartum depression can be mild or severe. Additionally, postpartum depression can resolve rather quickly, or it can be a long-term condition. °CAUSES °Elevated hormones and their rapid decline are thought to be a main cause of postpartum depression and the baby blues. There are a number of hormones that radically change during and after pregnancy. Estrogen and progesterone usually decrease immediately after delivering your baby. The level of thyroid hormone and various cortisol steroids also rapidly drop. Other factors that play a major role in these changes include major life events and genetics.  °RISK FACTORS °If you have any of the following risks for the baby blues or postpartum depression, know what symptoms to watch out for during the postpartum period. Risk factors that may increase the likelihood of getting the baby blues or postpartum depression include: °· Having a personal or family history of depression. °· Having depression while being pregnant. °· Having premenstrual or oral contraceptive-associated mood issues. °· Having exceptional life stress. °· Having marital conflict. °· Lacking a social support network. °· Having a baby with special needs. °· Having health problems such as diabetes. °SYMPTOMS °Baby blues symptoms include: °· Brief  fluctuations in mood, such as going from extreme happiness to sadness. °· Decreased concentration. °· Difficulty sleeping. °· Crying spells, tearfulness. °· Irritability. °· Anxiety. °Postpartum depression symptoms typically begin within the first month after giving birth. These symptoms include: °· Difficulty sleeping or excessive sleepiness. °· Marked weight loss. °· Agitation. °· Feelings of worthlessness. °· Lack of interest in activity or food. °Postpartum psychosis is a very concerning condition and can be dangerous. Fortunately, it is rare. Displaying any of the following symptoms is cause for immediate medical attention. Postpartum psychosis symptoms include: °· Hallucinations and delusions. °· Bizarre or disorganized behavior. °· Confusion or disorientation. °DIAGNOSIS  °A diagnosis is made by an evaluation of your symptoms. There are no medical or lab tests that lead to a diagnosis, but there are various questionnaires that a caregiver may use to identify those with the baby blues, postpartum depression, or psychosis. Often times, a screening tool called the Edinburgh Postnatal Depression Scale is used to diagnose depression in the postpartum period.  °TREATMENT °The baby blues usually goes away on its own in 1 to 2 weeks. Social support is often all that is needed. You should be encouraged to get adequate sleep and rest. Occasionally, you may be given medicines to help you sleep.  °Postpartum depression requires treatment as it can last several months or longer if it is not treated. Treatment may include individual or group therapy, medicine, or both to address any social, physiological, and psychological factors that may play a role in the depression. Regular exercise, a healthy diet, rest, and social support may also be strongly recommended.  °Postpartum psychosis is more serious and needs treatment right away. Hospitalization is   often needed. HOME CARE INSTRUCTIONS  Get as much rest as you can. Nap  when the baby sleeps.  Exercise regularly. Some women find yoga and walking to be beneficial.  Eat a balanced and nourishing diet.  Do little things that you enjoy. Have a cup of tea, take a bubble bath, read your favorite magazine, or listen to your favorite music.  Avoid alcohol.  Ask for help with household chores, cooking, grocery shopping, or running errands as needed. Do not try to do everything.  Talk to people close to you about how you are feeling. Get support from your partner, family members, friends, or other new moms.  Try to stay positive in how you think. Think about the things you are grateful for.  Do not spend a lot of time alone.  Only take medicine as directed by your caregiver.  Keep all your postpartum appointments.  Let your caregiver know if you have any concerns. SEEK MEDICAL CARE IF: You are having a reaction or problems with your medicine. SEEK IMMEDIATE MEDICAL CARE IF:  You have suicidal feelings.  You feel you may harm the baby or someone else. Document Released: 03/26/2004 Document Revised: 09/14/2011 Document Reviewed: 04/03/2013 [facility] Patient Information 2014 [place], [place]. Iron-Rich Diet An iron-rich diet contains foods that are good sources of iron. Iron is an important mineral that helps your body produce hemoglobin. Hemoglobin is a protein in red blood cells that carries oxygen to the body's tissues. Sometimes, the iron level in your blood can be low. This may be caused by:  A lack of iron in your diet.  Blood loss.  Times of growth, such as during pregnancy or during a child's growth and development. Low levels of iron can cause a decrease in the number of red blood cells. This can result in iron deficiency anemia. Iron deficiency anemia symptoms include:  Tiredness.  Weakness.  Irritability.  Increased chance of infection. Here are some recommendations for daily iron intake:  Males older than 39 years of age need 8 mg of  iron per day.  Women ages 71 to 74 need 18 mg of iron per day.  Pregnant women need 27 mg of iron per day, and women who are over 94 years of age and breastfeeding need 9 mg of iron per day.  Women over the age of 73 need 8 mg of iron per day. SOURCES OF IRON There are 2 types of iron that are found in food: heme iron and nonheme iron. Heme iron is absorbed by the body better than nonheme iron. Heme iron is found in meat, poultry, and fish. Nonheme iron is found in grains, beans, and vegetables. Heme Iron Sources Food / Iron (mg)  Chicken liver, 3 oz (85 g)/ 10 mg  Beef liver, 3 oz (85 g)/ 5.5 mg  Oysters, 3 oz (85 g)/ 8 mg  Beef, 3 oz (85 g)/ 2 to 3 mg  Shrimp, 3 oz (85 g)/ 2.8 mg  [place], 3 oz (85 g)/ 2 mg  Chicken, 3 oz (85 g) / 1 mg  Fish (tuna, halibut), 3 oz (85 g)/ 1 mg  Pork, 3 oz (85 g)/ 0.9 mg Nonheme Iron Sources Food / Iron (mg)  Ready-to-eat breakfast cereal, iron-fortified / 3.9 to 7 mg  Tofu,  cup / 3.4 mg  Kidney beans,  cup / 2.6 mg  Baked potato with skin / 2.7 mg  Asparagus,  cup / 2.2 mg  Avocado / 2 mg  Dried peaches,  cup / 1.6 mg  Raisins,  cup / 1.5 mg  Soy milk, 1 cup / 1.5 mg  Whole-wheat bread, 1 slice / 1.2 mg  Spinach, 1 cup / 0.8 mg  Broccoli,  cup / 0.6 mg IRON ABSORPTION Certain foods can decrease the body's absorption of iron. Try to avoid these foods and beverages while eating meals with iron-containing foods:  Coffee.  Tea.  Fiber.  Soy. Foods containing vitamin C can help increase the amount of iron your body absorbs from iron sources, especially from nonheme sources. Eat foods with vitamin C along with iron-containing foods to increase your iron absorption. Foods that are high in vitamin C include many fruits and vegetables. Some good sources are:  Fresh orange juice.  Oranges.  Strawberries.  [place]oes.  Grapefruit.  Red bell peppers.  Green bell peppers.  Broccoli.  Potatoes with  skin.  Tomato juice. Document Released: 02/03/2005 Document Revised: 09/14/2011 Document Reviewed: 12/11/2010 [facility] Patient Information 2014 [place], [place]. Postpartum Care After Cesarean Delivery After you deliver your newborn (postpartum period), the usual stay in the hospital is 24 72 hours. If there were problems with your labor or delivery, or if you have other medical problems, you might be in the hospital longer.  While you are in the hospital, you will receive help and instructions on how to care for yourself and your newborn during the postpartum period.  While you are in the hospital:  It is normal for you to have pain or discomfort from the incision in your abdomen. Be sure to tell your nurses when you are having pain, where the pain is located, and what makes the pain worse.  If you are breastfeeding, you may feel uncomfortable contractions of your uterus for a couple of weeks. This is normal. The contractions help your uterus get back to normal size.  It is normal to have some bleeding after delivery.  For the first 1 3 days after delivery, the flow is red and the amount may be similar to a period.  It is common for the flow to start and stop.  In the first few days, you may pass some small clots. Let your nurses know if you begin to pass large clots or your flow increases.  Do not  flush blood clots down the toilet before having the nurse look at them.  During the next 3 10 days after delivery, your flow should become more watery and pink or brown-tinged in color.  Ten to fourteen days after delivery, your flow should be a small amount of yellowish-white discharge.  The amount of your flow will decrease over the first few weeks after delivery. Your flow may stop in 6 8 weeks. Most women have had their flow stop by 12 weeks after delivery.  You should change your sanitary pads frequently.  Wash your hands thoroughly with soap and water for at least 20 seconds after  changing pads, using the toilet, or before holding or feeding your newborn.  Your intravenous (IV) tubing will be removed when you are drinking enough fluids.  The urine drainage tube (urinary catheter) that was inserted before delivery may be removed within 6 8 hours after delivery or when feeling returns to your legs. You should feel like you need to empty your bladder within the first 6 8 hours after the catheter has been removed.  In case you become weak, lightheaded, or faint, call your nurse before you get out of bed for the first time and  before you take a shower for the first time.  Within the first few days after delivery, your breasts may begin to feel tender and full. This is called engorgement. Breast tenderness usually goes away within 48 72 hours after engorgement occurs. You may also notice milk leaking from your breasts. If you are not breastfeeding, do not stimulate your breasts. Breast stimulation can make your breasts produce more milk.  Spending as much time as possible with your newborn is very important. During this time, you and your newborn can feel close and get to know each other. Having your newborn stay in your room (rooming in) will help to strengthen the bond with your newborn. It will give you time to get to know your newborn and become comfortable caring for your newborn.  Your hormones change after delivery. Sometimes the hormone changes can temporarily cause you to feel sad or tearful. These feelings should not last more than a few days. If these feelings last longer than that, you should talk to your caregiver.  If desired, talk to your caregiver about methods of family planning or contraception.  Talk to your caregiver about immunizations. Your caregiver may want you to have the following immunizations before leaving the hospital:  Tetanus, diphtheria, and pertussis (Tdap) or tetanus and diphtheria (Td) immunization. It is very important that you and your family  (including grandparents) or others caring for your newborn are up-to-date with the Tdap or Td immunizations. The Tdap or Td immunization can help protect your newborn from getting ill.  Rubella immunization.  Varicella (chickenpox) immunization.  Influenza immunization. You should receive this annual immunization if you did not receive the immunization during your pregnancy. Document Released: 03/16/2012 Document Reviewed: 03/16/2012 [facility] Patient Information 2014 [place], [place].

## 2013-11-11 NOTE — Discharge Summary (Signed)
Cesarean Section Delivery Discharge Summary  Briana Ford  DOB:    08/02/1975 MRN:    [id number] CSN:    [id number]  Date of admission:                  11/08/2013  Date of discharge:                   11/11/2013  Procedures this admission: Repeat C/S, MgSO4 therapy  Date of Delivery: 11/08/2013  Newborn Data:  Live born female  Birth Weight: 5 lb 13.1 oz (2640 g) APGAR: 9, 9  Home with mother. Name: Briana Ford Circumcision Plan: Inpt-Completed  History of Present Illness:  Ms. Briana Ford is a 38 y.o. female, [phone], who presents at [redacted]w[redacted]d weeks gestation. The patient has been followed at the [facility] and Gynecology division of [facility].    Her pregnancy has been complicated by:  Patient Active Problem List   Diagnosis Date Noted  . Chronic hypertension with superimposed preeclampsia 11/08/2013  . Previous cesarean delivery, antepartum condition or complication 44/31/5400  . Pregnancy w/ hx of uterine myomectomy--robotic 11/08/2013  . Pre-eclampsia, severe 11/08/2013  . Unspecified asthma 08/29/2012  . Fibroid 08/29/2012  . H/O unilateral salpingectomy 08/29/2012  . Essential hypertension, benign 08/29/2012   Preeclampsia.  Hospital course:  The patient was admitted for Pre-Eclampsia.   Her postpartum course was not complicated. Although she did receive MgSO4 therapy for 24 hours s/p delivery. She was discharged to home on postpartum day 3 doing well.  Feeding:  breast  Contraception:  no method  Discharge hemoglobin:  Hemoglobin  Date Value Ref Range Status  11/09/2013 11.3* 12.0 - 15.0 g/dL Final     HCT  Date Value Ref Range Status  11/09/2013 33.3* 36.0 - 46.0 % Final    Discharge Physical Exam:   General: alert, cooperative and no distress Lochia: appropriate Uterine Fundus: firm Incision: Honeycomb dressing-CDI- Care instructions given DVT Evaluation: No evidence of DVT seen on physical  exam. Negative Homan's sign.  Intrapartum Procedures: cesarean: low cervical, transverse Postpartum Procedures: MgSO4 Therapy Complications-Operative and Postpartum: none  Discharge Diagnoses: S/P Repeat C/S-Late Preterm Delivery-CHTN  Discharge Information:  Activity:           pelvic rest Diet:                Chronic Hypertension & Anemia-Low Sodium, Iron Rich Medications: Ibuprofen and Vicoprofen Condition:      stable Instructions:  Care After Cesarean Delivery  Refer to this sheet in the next few weeks. These instructions provide you with information on caring for yourself after your procedure. Your caregiver may also give you specific instructions. Your treatment has been planned according to current medical practices, but problems sometimes occur. Call your caregiver if you have any problems or questions after you go home. HOME CARE INSTRUCTIONS  Only take over-the-counter or prescription medicines as directed by your caregiver.  Do not drink alcohol, especially if you are breastfeeding or taking medicine to relieve pain.  Do not chew or smoke tobacco.  Continue to use good perineal care. Good perineal care includes:  Wiping your perineum from front to back.  Keeping your perineum clean.  Check your cut (incision) daily for increased redness, drainage, swelling, or separation of skin.  Clean your incision gently with soap and water every day, and then pat it dry. If your caregiver says it is okay, leave the incision uncovered. Use a bandage (dressing) if  the incision is draining fluid or appears irritated. If the adhesive strips across the incision do not fall off within 7 days, carefully peel them off.  Hug a pillow when coughing or sneezing until your incision is healed. This helps to relieve pain.  Do not use tampons or douche until your caregiver says it is okay.  Shower, wash your hair, and take tub baths as directed by your caregiver.  Wear a well-fitting bra  that provides breast support.  Limit wearing support panties or control-top hose.  Drink enough fluids to keep your urine clear or pale yellow.  Eat high-fiber foods such as whole grain cereals and breads, brown rice, beans, and fresh fruits and vegetables every day. These foods may help prevent or relieve constipation.  Resume activities such as climbing stairs, driving, lifting, exercising, or traveling as directed by your caregiver.  Talk to your caregiver about resuming sexual activities. This is dependent upon your risk of infection, your rate of healing, and your comfort and desire to resume sexual activity.  Try to have someone help you with your household activities and your newborn for at least a few days after you leave the hospital.  Rest as much as possible. Try to rest or take a nap when your newborn is sleeping.  Increase your activities gradually.  Keep all of your scheduled postpartum appointments. It is very important to keep your scheduled follow-up appointments. At these appointments, your caregiver will be checking to make sure that you are healing physically and emotionally. SEEK MEDICAL CARE IF:   You are passing large clots from your vagina. Save any clots to show your caregiver.  You have a foul smelling discharge from your vagina.  You have trouble urinating.  You are urinating frequently.  You have pain when you urinate.  You have a change in your bowel movements.  You have increasing redness, pain, or swelling near your incision.  You have pus draining from your incision.  Your incision is separating.  You have painful, hard, or reddened breasts.  You have a severe headache.  You have blurred vision or see spots.  You feel sad or depressed.  You have thoughts of hurting yourself or your newborn.  You have questions about your care, the care of your newborn, or medicines.  You are dizzy or lightheaded.  You have a rash.  You have pain,  redness, or swelling at the site of the removed intravenous access (IV) tube.  You have nausea or vomiting.  You stopped breastfeeding and have not had a menstrual period within 12 weeks of stopping.  You are not breastfeeding and have not had a menstrual period within 12 weeks of delivery.  You have a fever. SEEK IMMEDIATE MEDICAL CARE IF:  You have persistent pain.  You have chest pain.  You have shortness of breath.  You faint.  You have leg pain.  You have stomach pain.  Your vaginal bleeding saturates 2 or more sanitary pads in 1 hour. MAKE SURE YOU:   Understand these instructions.  Will watch your condition.  Will get help right away if you are not doing well or get worse. Document Released: 03/14/2002 Document Revised: 03/16/2012 Document Reviewed: 02/17/2012 [facility] Patient Information 2014 [place].   Postpartum Depression and Baby Blues  The postpartum period begins right after the birth of a baby. During this time, there is often a great amount of joy and excitement. It is also a time of considerable changes in the  life of the parent(s). Regardless of how many times a mother gives birth, each child brings new challenges and dynamics to the family. It is not unusual to have feelings of excitement accompanied by confusing shifts in moods, emotions, and thoughts. All mothers are at risk of developing postpartum depression or the "baby blues." These mood changes can occur right after giving birth, or they may occur many months after giving birth. The baby blues or postpartum depression can be mild or severe. Additionally, postpartum depression can resolve rather quickly, or it can be a long-term condition. CAUSES Elevated hormones and their rapid decline are thought to be a main cause of postpartum depression and the baby blues. There are a number of hormones that radically change during and after pregnancy. Estrogen and progesterone usually decrease immediately  after delivering your baby. The level of thyroid hormone and various cortisol steroids also rapidly drop. Other factors that play a major role in these changes include major life events and genetics.  RISK FACTORS If you have any of the following risks for the baby blues or postpartum depression, know what symptoms to watch out for during the postpartum period. Risk factors that may increase the likelihood of getting the baby blues or postpartum depression include:  Havinga personal or family history of depression.  Having depression while being pregnant.  Having premenstrual or oral contraceptive-associated mood issues.  Having exceptional life stress.  Having marital conflict.  Lacking a social support network.  Having a baby with special needs.  Having health problems such as diabetes. SYMPTOMS Baby blues symptoms include:  Brief fluctuations in mood, such as going from extreme happiness to sadness.  Decreased concentration.  Difficulty sleeping.  Crying spells, tearfulness.  Irritability.  Anxiety. Postpartum depression symptoms typically begin within the first month after giving birth. These symptoms include:  Difficulty sleeping or excessive sleepiness.  Marked weight loss.  Agitation.  Feelings of worthlessness.  Lack of interest in activity or food. Postpartum psychosis is a very concerning condition and can be dangerous. Fortunately, it is rare. Displaying any of the following symptoms is cause for immediate medical attention. Postpartum psychosis symptoms include:  Hallucinations and delusions.  Bizarre or disorganized behavior.  Confusion or disorientation. DIAGNOSIS  A diagnosis is made by an evaluation of your symptoms. There are no medical or lab tests that lead to a diagnosis, but there are various questionnaires that a caregiver may use to identify those with the baby blues, postpartum depression, or psychosis. Often times, a screening tool called  the [place] Postnatal Depression Scale is used to diagnose depression in the postpartum period.  TREATMENT The baby blues usually goes away on its own in 1 to 2 weeks. Social support is often all that is needed. You should be encouraged to get adequate sleep and rest. Occasionally, you may be given medicines to help you sleep.  Postpartum depression requires treatment as it can last several months or longer if it is not treated. Treatment may include individual or group therapy, medicine, or both to address any social, physiological, and psychological factors that may play a role in the depression. Regular exercise, a healthy diet, rest, and social support may also be strongly recommended.  Postpartum psychosis is more serious and needs treatment right away. Hospitalization is often needed. HOME CARE INSTRUCTIONS  Get as much rest as you can. Nap when the baby sleeps.  Exercise regularly. Some women find yoga and walking to be beneficial.  Eat a balanced and nourishing diet.  Do little things that you enjoy. Have a cup of tea, take a bubble bath, read your favorite magazine, or listen to your favorite music.  Avoid alcohol.  Ask for help with household chores, cooking, grocery shopping, or running errands as needed. Do not try to do everything.  Talk to people close to you about how you are feeling. Get support from your partner, family members, friends, or other new moms.  Try to stay positive in how you think. Think about the things you are grateful for.  Do not spend a lot of time alone.  Only take medicine as directed by your caregiver.  Keep all your postpartum appointments.  Let your caregiver know if you have any concerns. SEEK MEDICAL CARE IF: You are having a reaction or problems with your medicine. SEEK IMMEDIATE MEDICAL CARE IF:  You have suicidal feelings.  You feel you may harm the baby or someone else. Document Released: 03/26/2004 Document Revised: 09/14/2011  Document Reviewed: 04/28/2011 [facility] Patient Information 2014 [place], [place].  Discharge to: home  Follow-up Information   Follow up with [facility]. Schedule an appointment as soon as possible for a visit in 5 weeks. (Please call if you have any questions or concerns prior to you next visit. Smart start nurse will come to your house in 1 week for blood pressure check. )    Specialty:  Obstetrics and Gynecology   Contact information:   [address] [place] [address] [phone]       Gavin Pound , CNM, MSN 11/11/2013

## 2013-11-11 NOTE — Lactation Note (Signed)
This note was copied from the chart of [place]. Lactation Consultation Note: Follow up visit with mom before DC. She reports that baby has been nursing well. Did better this morning. Has DEBP set up in room. Reports that she pumped once during the night because he was sleepy and not nursing. Has her own [place] for home use. Reports that breasts are feeling a little fuller this morning. No questions at present. To call prn  Patient Name: Briana Ford XAJOI'N Date: 11/11/2013 Reason for consult: Follow-up assessment   Maternal Data Does the patient have breastfeeding experience prior to this delivery?: Yes  Feeding   LATCH Score/Interventions                      Lactation Tools Discussed/Used     Consult Status Consult Status: Complete    Truddie Crumble 11/11/2013, 10:14 AM

## 2013-11-17 NOTE — H&P (Signed)
38 yo G4P1111 at 35 6/7 weeks presented from office for BP evaluation due to elevated BP in office, 140-150/90-112.  Denies HA, visual sx, or epigastric pain. On Procardia 30 XL daily during pregnancy, with last dose at 6:30am.   Has been on 17P due to hx of 22 week loss with PPROM.  Last dose 17p last week.   GBS pending from today in office.   Patient's last meal was at 8:30am, with omelet and muffin.    Patient Active Problem List     Diagnosis  Date Noted   .  Chronic hypertension with superimposed preeclampsia  11/08/2013   .  Previous cesarean delivery, antepartum condition or complication  [id number]   .  Pregnancy w/ hx of uterine myomectomy--robotic  11/08/2013   .  H/O preterm delivery, currently pregnant--22 weeks,, PPROM.  11/08/2013   .  Unspecified asthma  08/29/2012   .  Fibroid  08/29/2012   .  H/O unilateral salpingectomy  08/29/2012   .  Essential hypertension, benign  08/29/2012       Chief Complaint   Patient presents with   .  Hypertension    HPI: See above    OB History     Grav  Para  Term  Preterm  Abortions  TAB  SAB  Ect  Mult  Living     4  2  1  1  1      1    1           Past Medical History   Diagnosis  Date   .  Hx of ectopic pregnancy  09/24/2004       Right   .  Abnormal Pap smear  1999       LEEP   .  Monilial vaginitis  2005   .  Hx gestational diabetes     .  Breast mass, right  2006   .  First trimester bleeding  2006       ECTOPIC   .  BV (bacterial vaginosis)  2006   .  Elevated BP  2008   .  H/O hemorrhoids  2009   .  H/O: menorrhagia  03/26/11   .  Sickle-cell trait     .  Fibroid  04/15/11   .  Allergy     .  Asthma     .  Hypertension     .  Diabetes mellitus without complication  [id number]       gestational   .  Anemia         has rx for iron but doesnt take due to constipation   .  PONV (postoperative nausea and vomiting)     .  Gestational diabetes     .  Infection         UTI   .  Ovarian cyst     .  Vaginal Pap  smear, abnormal         Past Surgical History   Procedure  Laterality  Date   .  Wisdom tooth extraction       .  Cesarean section       .  Leep       .  Robot assisted myomectomy  N/A  09/07/2012       Procedure: ROBOTIC ASSISTED MYOMECTOMY;  Surgeon: Alwyn Pea, MD;  Location: [facility];  Service: Gynecology;  Laterality: N/A;   .  Laparotomy  for removal of ectopic preg       Family History   Problem  Relation  Age of Onset   .  Hypertension  Mother     .  Arthritis  Mother     .  Graves' disease  Mother     .  Diabetes  Mother     .  Diabetes  Maternal Grandmother         type 2    .  Heart failure  Maternal Grandmother     .  Hearing loss  Paternal Grandmother         with age       History   Substance Use Topics   .  Smoking status:  Never Smoker    .  Smokeless tobacco:  Never Used   .  Alcohol Use:  Yes         Comment: rare and not with preg      Allergies: No Known Allergies    Prescriptions prior to admission   Medication  Sig  Dispense  Refill   .  acetaminophen (TYLENOL) 500 MG tablet  Take 500 mg by mouth every 6 (six) hours as needed for mild pain.         Marland Kitchen  albuterol (PROVENTIL HFA;VENTOLIN HFA) 108 (90 BASE) MCG/ACT inhaler  Inhale 2 puffs into the lungs every 6 (six) hours as needed for wheezing.         .  budesonide-formoterol (SYMBICORT) 160-4.5 MCG/ACT inhaler  Inhale 2 puffs into the lungs 2 (two) times daily.         .  montelukast (SINGULAIR) 10 MG tablet  Take 10 mg by mouth at bedtime.         Marland Kitchen  NIFEdipine (PROCARDIA-XL/ADALAT-CC/NIFEDICAL-XL) 30 MG 24 hr tablet  Take 30 mg by mouth daily.            ROS:  Denies any sx. Physical Exam      Blood pressure 155/112, pulse 84, temperature 98.2 F (36.8 C), temperature source Oral, resp. rate 20.    Filed Vitals:     11/08/13 1204  11/08/13 1219  11/08/13 1249  11/08/13 1304   BP:  159/104  158/102  139/88  155/112   Pulse:  88  80  90  84   Temp:           TempSrc:            Resp:            BP range 139-167/88-116 in MAU Received 2 doses IV Labetalol in MAU 1312 and 1349, with no benefit in BP.     Physical Exam Chest clear Heart RRR without murmur Abd gravid, NT Pelvic--deferred Ext--DTR 1+, no clonus, 1+ edema   FHR Category 1 UCs none    Results for orders placed during the hospital encounter of 11/08/13 (from the past 24 hour(s))   PROTEIN / CREATININE RATIO, URINE     Status: Abnormal     Collection Time      11/08/13 11:17 AM       Result  Value  Ref Range     Creatinine, Urine  102.57        Total Protein, Urine  74.1        PROTEIN CREATININE RATIO  0.72 (*)  0.00 - 0.15   CBC     Status: None     Collection Time      11/08/13 12:25  PM       Result  Value  Ref Range     WBC  8.5   4.0 - 10.5 K/uL     RBC  4.13   3.87 - 5.11 MIL/uL     Hemoglobin  12.8   12.0 - 15.0 g/dL     HCT  37.1   36.0 - 46.0 %     MCV  89.8   78.0 - 100.0 fL     MCH  31.0   26.0 - 34.0 pg     MCHC  34.5   30.0 - 36.0 g/dL     RDW  13.4   11.5 - 15.5 %     Platelets  257   150 - 400 K/uL   COMPREHENSIVE METABOLIC PANEL     Status: Abnormal     Collection Time      11/08/13 12:25 PM       Result  Value  Ref Range     Sodium  136 (*)  137 - 147 mEq/L     Potassium  4.9   3.7 - 5.3 mEq/L     Chloride  102   96 - 112 mEq/L     CO2  20   19 - 32 mEq/L     Glucose, Bld  61 (*)  70 - 99 mg/dL     BUN  14   6 - 23 mg/dL     Creatinine, Ser  0.69   0.50 - 1.10 mg/dL     Calcium  10.1   8.4 - 10.5 mg/dL     Total Protein  6.5   6.0 - 8.3 g/dL     Albumin  2.8 (*)  3.5 - 5.2 g/dL     AST  25   0 - 37 U/L     ALT  19   0 - 35 U/L     Alkaline Phosphatase  103   39 - 117 U/L     Total Bilirubin  <0.2 (*)  0.3 - 1.2 mg/dL     GFR calc non Af Amer  >90   >90 mL/min     GFR calc Af Amer  >90   >90 mL/min   LACTATE DEHYDROGENASE     Status: None     Collection Time      11/08/13 12:25 PM       Result  Value  Ref Range     LDH  246   94 - 250 U/L   URIC  ACID     Status: Abnormal     Collection Time      11/08/13 12:25 PM       Result  Value  Ref Range     Uric Acid, Serum  8.7 (*)  2.4 - 7.0 mg/dL       Prenatal Transfer Tool    Maternal Diabetes: No Genetic Screening: Declined Maternal Ultrasounds/Referrals: Normal Fetal Ultrasounds or other Referrals:  None Maternal Substance Abuse:  No Significant Maternal Medications:  Meds include: Other: Procardia 30 mg XL; Significant Maternal Lab Results: Lab values include: Other: GBS pending from today in office.          ED Course    IUP at 35 6/7 weeks Chronic hypertension with superimposed pre-eclampsia GBS pending from today. Previous C/s and myomectomy--planning repeat C/S   Plan: Admitted to [facility] per consult with Dr. Shanon Ace proceed with repeat C/S. Routine pre-op orders R&B of C/S reviewed with patient  and husband, including bleeding, infection, and damage to other organs.  Patient and husband seem to understand these risks and wish to proceed. Plan Magnesium sulfate for 24 hours post-delivery for seizure prophylaxis.     Donnel Saxon CNM, MSN 11/08/2013 2:42 PM    Cosigned by: Alwyn Pea, MD    [11/09/2013 2:51 PM]

## 2013-11-30 ENCOUNTER — Inpatient Hospital Stay (HOSPITAL_COMMUNITY)
Admission: AD | Admit: 2013-11-30 | Payer: Managed Care, Other (non HMO) | Source: Ambulatory Visit | Admitting: Obstetrics and Gynecology

## 2014-05-07 ENCOUNTER — Encounter (HOSPITAL_COMMUNITY): Payer: Self-pay | Admitting: Obstetrics and Gynecology

## 2019-02-08 ENCOUNTER — Other Ambulatory Visit: Payer: Self-pay | Admitting: Obstetrics and Gynecology

## 2021-06-30 ENCOUNTER — Ambulatory Visit (INDEPENDENT_AMBULATORY_CARE_PROVIDER_SITE_OTHER): Payer: 59

## 2021-06-30 ENCOUNTER — Other Ambulatory Visit: Payer: Self-pay

## 2021-06-30 ENCOUNTER — Ambulatory Visit (HOSPITAL_COMMUNITY)
Admission: EM | Admit: 2021-06-30 | Discharge: 2021-06-30 | Disposition: A | Discharge status: Home / Self Care | Payer: 59 | Attending: Physician Assistant | Admitting: Physician Assistant

## 2021-06-30 ENCOUNTER — Encounter (HOSPITAL_COMMUNITY): Payer: Self-pay | Admitting: Emergency Medicine

## 2021-06-30 DIAGNOSIS — S52502A Unspecified fracture of the lower end of left radius, initial encounter for closed fracture: Secondary | ICD-10-CM

## 2021-06-30 DIAGNOSIS — S4992XA Unspecified injury of left shoulder and upper arm, initial encounter: Secondary | ICD-10-CM

## 2021-06-30 DIAGNOSIS — W19XXXA Unspecified fall, initial encounter: Secondary | ICD-10-CM

## 2021-06-30 DIAGNOSIS — S52501A Unspecified fracture of the lower end of right radius, initial encounter for closed fracture: Secondary | ICD-10-CM

## 2021-06-30 MED ORDER — IBUPROFEN 800 MG PO TABS
800.0000 mg | ORAL_TABLET | Freq: Once | ORAL | Status: AC
  Administered 2021-06-30: 18:00:00 800 mg via ORAL

## 2021-06-30 MED ORDER — IBUPROFEN 800 MG PO TABS
ORAL_TABLET | ORAL | Status: AC
  Filled 2021-06-30: qty 1

## 2021-06-30 NOTE — ED Triage Notes (Signed)
Pt was roller skating and fell trying to brace fall with left hand. Pt having pain in hand, wrist and forearm.

## 2021-06-30 NOTE — Progress Notes (Signed)
Orthopedic Tech Progress Note Patient Details:  Briana Ford Jun 27, 1976 [id number]  Ortho Devices Type of Ortho Device: Sugartong splint, Arm sling Ortho Device/Splint Location: left Ortho Device/Splint Interventions: Ordered, Application, Adjustment   Post Interventions Patient Tolerated: Well Instructions Provided: Care of device, Adjustment of device  Briana Ford Tiler Brandis 06/30/2021, 6:26 PM Applied sugar tong splint and arm sling.

## 2021-06-30 NOTE — Discharge Instructions (Signed)
As we discussed, you have a fracture of your distal radius.  Please call and schedule an appointment with your orthopedic provider first thing tomorrow.  If you are unable to see them please call [facility] to arrange follow-up.  Alternate Tylenol and ibuprofen for pain.  Keep arm elevated.  If you develop any worsening symptoms including increased pain, swelling, cold fingers, numbness, tingling sensation you need to be seen immediately.

## 2021-06-30 NOTE — ED Notes (Signed)
Ortho tech at bedside 

## 2021-06-30 NOTE — ED Provider Notes (Addendum)
[facility]    CSN: [id number] Arrival date & time: 06/30/21  1654      History   Chief Complaint Chief Complaint  Patient presents with   Arm Injury    HPI Briana Ford is a 45 y.o. female.   Patient presents today with severe left wrist pain following injury.  Reports she was rollerskating when she fell onto an outstretched left hand.  Since that time she has had ongoing left wrist pain with radiation into the forearm.  Pain is rated 10 on a 0-10 pain scale, described as throbbing/sharp, no aggravating relieving factors identified.  She has tried a past milligrams of Tylenol without improvement of symptoms and is requesting medication if appropriate today.  She is right-handed.  Reports that she has some numbness into her left forearm but is able to move fingers and denies any numbness or paresthesias involving digits.  She denies previous injury or surgery involving her arm or wrist.   Past Medical History:  Diagnosis Date   Abnormal Pap smear 1999   LEEP   Allergy    Anemia    has rx for iron but doesnt take due to constipation   Asthma    Breast mass, right 2006   BV (bacterial vaginosis) 2006   Diabetes mellitus without complication ([place]) 3244   gestational   Elevated BP 2008   Fibroid 04/15/11   First trimester bleeding 2006   ECTOPIC   Gestational diabetes    H/O hemorrhoids 2009   H/O: menorrhagia 03/26/11   Hx gestational diabetes    Hx of ectopic pregnancy 09/24/2004   Right   Hypertension    Infection    UTI   Monilial vaginitis 2005   Ovarian cyst    PONV (postoperative nausea and vomiting)    Sickle-cell trait ([place])    Vaginal Pap smear, abnormal     Patient Active Problem List   Diagnosis Date Noted   Chronic hypertension with superimposed preeclampsia 11/08/2013   Previous cesarean delivery, antepartum condition or complication 07/08/7251   Pregnancy w/ hx of uterine myomectomy--robotic 11/08/2013   Pre-eclampsia, severe  11/08/2013   Unspecified asthma(493.90) 08/29/2012   Fibroid 08/29/2012   H/O unilateral salpingectomy 08/29/2012   Essential hypertension, benign 08/29/2012    Past Surgical History:  Procedure Laterality Date   CESAREAN SECTION     CESAREAN SECTION N/A 11/08/2013   Procedure: REPEAT CESAREAN SECTION;  Surgeon: Alwyn Pea, MD;  Location: [facility];  Service: Obstetrics;  Laterality: N/A;   LAPAROTOMY     for removal of ectopic preg   LEEP     ROBOT ASSISTED MYOMECTOMY N/A 09/07/2012   Procedure: ROBOTIC ASSISTED MYOMECTOMY;  Surgeon: Alwyn Pea, MD;  Location: [facility];  Service: Gynecology;  Laterality: N/A;   WISDOM TOOTH EXTRACTION      OB History   This patient's OB History needs to be verified. Open OB History to review and resolve any issues.  Gravida  4   Para  3   Term  1   Preterm  2   AB  1   Living  2      SAB      IAB      Ectopic  1   Multiple      Live Births  3            Home Medications    Prior to Admission medications   Medication Sig Start Date End Date Taking? Authorizing  Provider  ferrous sulfate 325 (65 FE) MG tablet Take 1 tablet (325 mg total) by mouth daily with breakfast. 11/11/13   Gavin Pound, CNM  HYDROcodone-ibuprofen (VICOPROFEN) 7.5-200 MG per tablet Take 1 tablet by mouth every 8 (eight) hours as needed for moderate pain. 11/11/13   Gavin Pound, CNM  ibuprofen (ADVIL,MOTRIN) 600 MG tablet Take 1 tablet (600 mg total) by mouth every 6 (six) hours. 11/11/13   Gavin Pound, CNM  NIFEdipine (PROCARDIA-XL/ADALAT-CC/NIFEDICAL-XL) 30 MG 24 hr tablet Take 30 mg by mouth daily.    [provider]    Family History Family History  Problem Relation Age of Onset   Hypertension Mother    Arthritis Mother    Berenice Primas' disease Mother    Diabetes Mother    Diabetes Maternal Grandmother        type 2    Heart failure Maternal Grandmother    Hearing loss Paternal Grandmother        with age    Social History Social  History   Tobacco Use   Smoking status: Never   Smokeless tobacco: Never  Substance Use Topics   Alcohol use: Yes    Comment: rare and not with preg   Drug use: No     Allergies   Patient has no known allergies.   Review of Systems Review of Systems  Constitutional:  Positive for activity change. Negative for appetite change, fatigue and fever.  Musculoskeletal:  Positive for arthralgias and joint swelling. Negative for myalgias.  Skin:  Negative for color change and wound.  Neurological:  Positive for numbness. Negative for dizziness, weakness, light-headedness and headaches.    Physical Exam Triage Vital Signs ED Triage Vitals  Enc Vitals Group     BP 06/30/21 1755 (!) 144/82     Pulse Rate 06/30/21 1755 88     Resp 06/30/21 1755 20     Temp 06/30/21 1755 98.5 F (36.9 C)     Temp Source 06/30/21 1755 Oral     SpO2 06/30/21 1755 99 %     Weight --      Height --      Head Circumference --      Peak Flow --      Pain Score 06/30/21 1753 10     Pain Loc --      Pain Edu? --      Excl. in [place]? --    No data found.  Updated Vital Signs BP (!) 144/82 (BP Location: Right Arm)    Pulse 88    Temp 98.5 F (36.9 C) (Oral)    Resp 20    LMP 06/17/2021    SpO2 99%   Visual Acuity Right Eye Distance:   Left Eye Distance:   Bilateral Distance:    Right Eye Near:   Left Eye Near:    Bilateral Near:     Physical Exam Vitals reviewed.  Constitutional:      General: She is awake. She is not in acute distress.    Appearance: Normal appearance. She is well-developed. She is not ill-appearing.     Comments: Very pleasant female appears stated age in no acute distress sitting comfortably on exam table  HENT:     Head: Normocephalic and atraumatic.  Cardiovascular:     Rate and Rhythm: Normal rate and regular rhythm.     Pulses:          Radial pulses are 2+ on the right side and 2+ on the  left side.     Heart sounds: Normal heart sounds, S1 normal and S2 normal. No  murmur heard.    Comments: Unable to assess capillary refill due to painted nails. Pulmonary:     Effort: Pulmonary effort is normal.     Breath sounds: Normal breath sounds. No wheezing, rhonchi or rales.     Comments: Clear to auscultation bilaterally Musculoskeletal:     Left wrist: Swelling, tenderness and bony tenderness present. No deformity or snuff box tenderness. Decreased range of motion.     Comments: Left wrist/hand: Hand is neurovascularly intact.  Normal active range of motion of phalanges.  Decreased range of motion at wrist secondary to pain.  Swelling noted over distal radius without deformity.  No snuffbox tenderness.  Psychiatric:        Behavior: Behavior is cooperative.     UC Treatments / Results  Labs (all labs ordered are listed, but only abnormal results are displayed) Labs Reviewed - No data to display  EKG   Radiology DG Wrist Complete Left  Result Date: 06/30/2021 CLINICAL DATA:  Fall onto outstretched hand EXAM: LEFT WRIST - COMPLETE 3+ VIEW COMPARISON:  None. FINDINGS: Acute comminuted and impacted distal radius fracture with less than 1/4 shaft diameter dorsal displacement of distal fracture fragment with minimal dorsal angulation. IMPRESSION: Acute comminuted, mildly displaced and angulated distal radius fracture. Electronically Signed   By: Donavan Foil M.D.   On: 06/30/2021 18:13    Procedures Procedures (including critical care time)  Medications Ordered in UC Medications  ibuprofen (ADVIL) tablet 800 mg (800 mg Oral Given 06/30/21 1812)    Initial Impression / Assessment and Plan / UC Course  I have reviewed the triage vital signs and the nursing notes.  Pertinent labs & imaging results that were available during my care of the patient were reviewed by me and considered in my medical decision making (see chart for details).     X-ray obtained given mechanism of injury showed comminuted mildly displaced distal radial fracture.  Patient  was placed in sugar-tong splint by [facility] and given sling for symptom relief.  We discussed pain management and patient was not interested in opioid medication.  She will alternate Tylenol and ibuprofen and was given ibuprofen in clinic today.  Discussed the importance of following up with orthopedics as often these types of fractures require ORIF.  Patient is already established with an orthopedic clinic and will call them to schedule an appointment but she was given contact information for our specialist on-call in case she has any difficulty scheduling appointment.  Discussed alarm symptoms that warrant emergent evaluation including signs/symptoms of compartment syndrome.  She was provided work excuse note.  Strict return precautions given to which she expressed understanding.  Final Clinical Impressions(s) / UC Diagnoses   Final diagnoses:  Closed fracture of distal end of right radius, unspecified fracture morphology, initial encounter  Fall, initial encounter  Injury of left upper extremity, initial encounter     Discharge Instructions      As we discussed, you have a fracture of your distal radius.  Please call and schedule an appointment with your orthopedic provider first thing tomorrow.  If you are unable to see them please call [facility] to arrange follow-up.  Alternate Tylenol and ibuprofen for pain.  Keep arm elevated.  If you develop any worsening symptoms including increased pain, swelling, cold fingers, numbness, tingling sensation you need to be seen immediately.     ED Prescriptions  None    I have reviewed the PDMP during this encounter.   Terrilee Croak, PA-C 06/30/21 1840    RaspetDerry Skill, PA-C 07/01/21 1532

## 2021-08-24 ENCOUNTER — Ambulatory Visit
Admission: RE | Admit: 2021-08-24 | Discharge: 2021-08-24 | Disposition: A | Discharge status: Home / Self Care | Payer: 59 | Source: Ambulatory Visit | Attending: Physician Assistant | Admitting: Physician Assistant

## 2021-08-24 ENCOUNTER — Other Ambulatory Visit: Payer: Self-pay

## 2021-08-24 VITALS — BP 133/95 | HR 117 | Temp 98.7°F | Ht 61.0 in | Wt 204.1 lb

## 2021-08-24 DIAGNOSIS — J069 Acute upper respiratory infection, unspecified: Secondary | ICD-10-CM | POA: Diagnosis not present

## 2021-08-24 NOTE — ED Triage Notes (Signed)
Patient c/o cough, congestion and SOB x 4 days.  Patient has a history of asthma, worried about cough.  Patient has taken Tylenol Cold & Flu, Albuterol Inhaler, albuterol nebulizer machine.

## 2021-08-24 NOTE — ED Provider Notes (Signed)
[facility]    CSN: [id number] Arrival date & time: 08/24/21  1208      History   Chief Complaint Chief Complaint  Patient presents with   Cough    HPI Briana Ford is a 46 y.o. female.   Patient here today for evaluation of cough, congestion and shortness of breath that started 4 days ago.  She has had some fever.  She has not had any nausea, vomiting or diarrhea.  She does have history of asthma and is concerned about her cough.  She reports she has tried multiple over-the-counter medicines and her albuterol inhaler with temporary relief.  She notes she did have COVID about a year ago and did require prednisone eventually.  The history is provided by the patient.  Cough Associated symptoms: shortness of breath, sore throat (improved somewhat today) and wheezing   Associated symptoms: no chills, no ear pain, no eye discharge and no fever    Past Medical History:  Diagnosis Date   Abnormal Pap smear 1999   LEEP   Allergy    Anemia    has rx for iron but doesnt take due to constipation   Asthma    Breast mass, right 2006   BV (bacterial vaginosis) 2006   Diabetes mellitus without complication ([place]) 5945   gestational   Elevated BP 2008   Fibroid 04/15/11   First trimester bleeding 2006   ECTOPIC   Gestational diabetes    H/O hemorrhoids 2009   H/O: menorrhagia 03/26/11   Hx gestational diabetes    Hx of ectopic pregnancy 09/24/2004   Right   Hypertension    Infection    UTI   Monilial vaginitis 2005   Ovarian cyst    PONV (postoperative nausea and vomiting)    Sickle-cell trait ([place])    Vaginal Pap smear, abnormal     Patient Active Problem List   Diagnosis Date Noted   Chronic hypertension with superimposed preeclampsia 11/08/2013   Previous cesarean delivery, antepartum condition or complication 85/92/9244   Pregnancy w/ hx of uterine myomectomy--robotic 11/08/2013   Pre-eclampsia, severe 11/08/2013   Unspecified asthma(493.90)  08/29/2012   Fibroid 08/29/2012   H/O unilateral salpingectomy 08/29/2012   Essential hypertension, benign 08/29/2012    Past Surgical History:  Procedure Laterality Date   CESAREAN SECTION     CESAREAN SECTION N/A 11/08/2013   Procedure: REPEAT CESAREAN SECTION;  Surgeon: Alwyn Pea, MD;  Location: [facility];  Service: Obstetrics;  Laterality: N/A;   LAPAROTOMY     for removal of ectopic preg   LEEP     ROBOT ASSISTED MYOMECTOMY N/A 09/07/2012   Procedure: ROBOTIC ASSISTED MYOMECTOMY;  Surgeon: Alwyn Pea, MD;  Location: [facility];  Service: Gynecology;  Laterality: N/A;   WISDOM TOOTH EXTRACTION      OB History   This patient's OB History needs to be verified. Open OB History to review and resolve any issues.  Gravida  4   Para  3   Term  1   Preterm  2   AB  1   Living  2      SAB      IAB      Ectopic  1   Multiple      Live Births  3            Home Medications    Prior to Admission medications   Medication Sig Start Date End Date Taking? Authorizing Provider  ibuprofen (  ADVIL,MOTRIN) 600 MG tablet Take 1 tablet (600 mg total) by mouth every 6 (six) hours. 11/11/13  Yes Gavin Pound, CNM  NIFEdipine (PROCARDIA-XL/ADALAT-CC/NIFEDICAL-XL) 30 MG 24 hr tablet Take 30 mg by mouth daily.   Yes [provider]  ferrous sulfate 325 (65 FE) MG tablet Take 1 tablet (325 mg total) by mouth daily with breakfast. 11/11/13   Gavin Pound, CNM  HYDROcodone-ibuprofen (VICOPROFEN) 7.5-200 MG per tablet Take 1 tablet by mouth every 8 (eight) hours as needed for moderate pain. 11/11/13   Gavin Pound, CNM    Family History Family History  Problem Relation Age of Onset   Hypertension Mother    Arthritis Mother    Berenice Primas' disease Mother    Diabetes Mother    Diabetes Maternal Grandmother        type 2    Heart failure Maternal Grandmother    Hearing loss Paternal Grandmother        with age    Social History Social History   Tobacco Use   Smoking  status: Never   Smokeless tobacco: Never  Substance Use Topics   Alcohol use: Yes    Comment: rare and not with preg   Drug use: No     Allergies   Patient has no known allergies.   Review of Systems Review of Systems  Constitutional:  Negative for chills and fever.  HENT:  Positive for congestion and sore throat (improved somewhat today). Negative for ear pain.   Eyes:  Negative for discharge and redness.  Respiratory:  Positive for cough, shortness of breath and wheezing.   Gastrointestinal:  Negative for abdominal pain, diarrhea, nausea and vomiting.    Physical Exam Triage Vital Signs ED Triage Vitals  Enc Vitals Group     BP 08/24/21 1219 (!) 133/95     Pulse Rate 08/24/21 1219 (!) 117     Resp --      Temp 08/24/21 1219 98.7 F (37.1 C)     Temp Source 08/24/21 1219 Oral     SpO2 08/24/21 1219 96 %     Weight 08/24/21 1220 204 lb 2.3 oz (92.6 kg)     Height 08/24/21 1220 5\' 1"  (1.549 m)     Head Circumference --      Peak Flow --      Pain Score 08/24/21 1220 0     Pain Loc --      Pain Edu? --      Excl. in [place]? --    No data found.  Updated Vital Signs BP (!) 133/95 (BP Location: Left Arm)    Pulse (!) 117    Temp 98.7 F (37.1 C) (Oral)    Ht 5\' 1"  (1.549 m)    Wt 204 lb 2.3 oz (92.6 kg)    LMP 07/24/2021    SpO2 96%    Breastfeeding No    BMI 38.57 kg/m      Physical Exam Vitals and nursing note reviewed.  Constitutional:      General: She is not in acute distress.    Appearance: Normal appearance. She is not ill-appearing.  HENT:     Head: Normocephalic and atraumatic.     Right Ear: Tympanic membrane normal.     Left Ear: Tympanic membrane normal.     Nose: Congestion present.     Mouth/Throat:     Mouth: Mucous membranes are moist.     Pharynx: No oropharyngeal exudate or posterior oropharyngeal erythema.  Eyes:  Conjunctiva/sclera: Conjunctivae normal.  Cardiovascular:     Rate and Rhythm: Normal rate and regular rhythm.     Heart  sounds: Normal heart sounds. No murmur heard. Pulmonary:     Effort: Pulmonary effort is normal. No respiratory distress.     Breath sounds: Normal breath sounds. No wheezing, rhonchi or rales.  Skin:    General: Skin is warm and dry.  Neurological:     Mental Status: She is alert.  Psychiatric:        Mood and Affect: Mood normal.        Thought Content: Thought content normal.     UC Treatments / Results  Labs (all labs ordered are listed, but only abnormal results are displayed) Labs Reviewed  COVID-19, FLU A+B NAA    EKG   Radiology No results found.  Procedures Procedures (including critical care time)  Medications Ordered in UC Medications - No data to display  Initial Impression / Assessment and Plan / UC Course  I have reviewed the triage vital signs and the nursing notes.  Pertinent labs & imaging results that were available during my care of the patient were reviewed by me and considered in my medical decision making (see chart for details).  Suspect viral etiology of symptoms.  Will screen for COVID.  Will await results further recommendation.  Discussed that she may be a good candidate for steroid burst for asthma exacerbation if COVID is negative. Encourage follow up with any further concerns.   Final Clinical Impressions(s) / UC Diagnoses   Final diagnoses:  Acute upper respiratory infection   Discharge Instructions   None    ED Prescriptions   None    PDMP not reviewed this encounter.   Francene Finders, PA-C 08/24/21 1307

## 2021-08-25 LAB — COVID-19, FLU A+B NAA
Influenza A, NAA: NOT DETECTED
Influenza B, NAA: NOT DETECTED
SARS-CoV-2, NAA: NOT DETECTED

## 2023-03-13 IMAGING — DX DG WRIST COMPLETE 3+V*L*
3 series · 3 of 3 positions shown · non-contrast
Comparison: None.

CLINICAL DATA: Fall onto outstretched hand

EXAM:
LEFT WRIST - COMPLETE 3+ VIEW

[wrist pa]
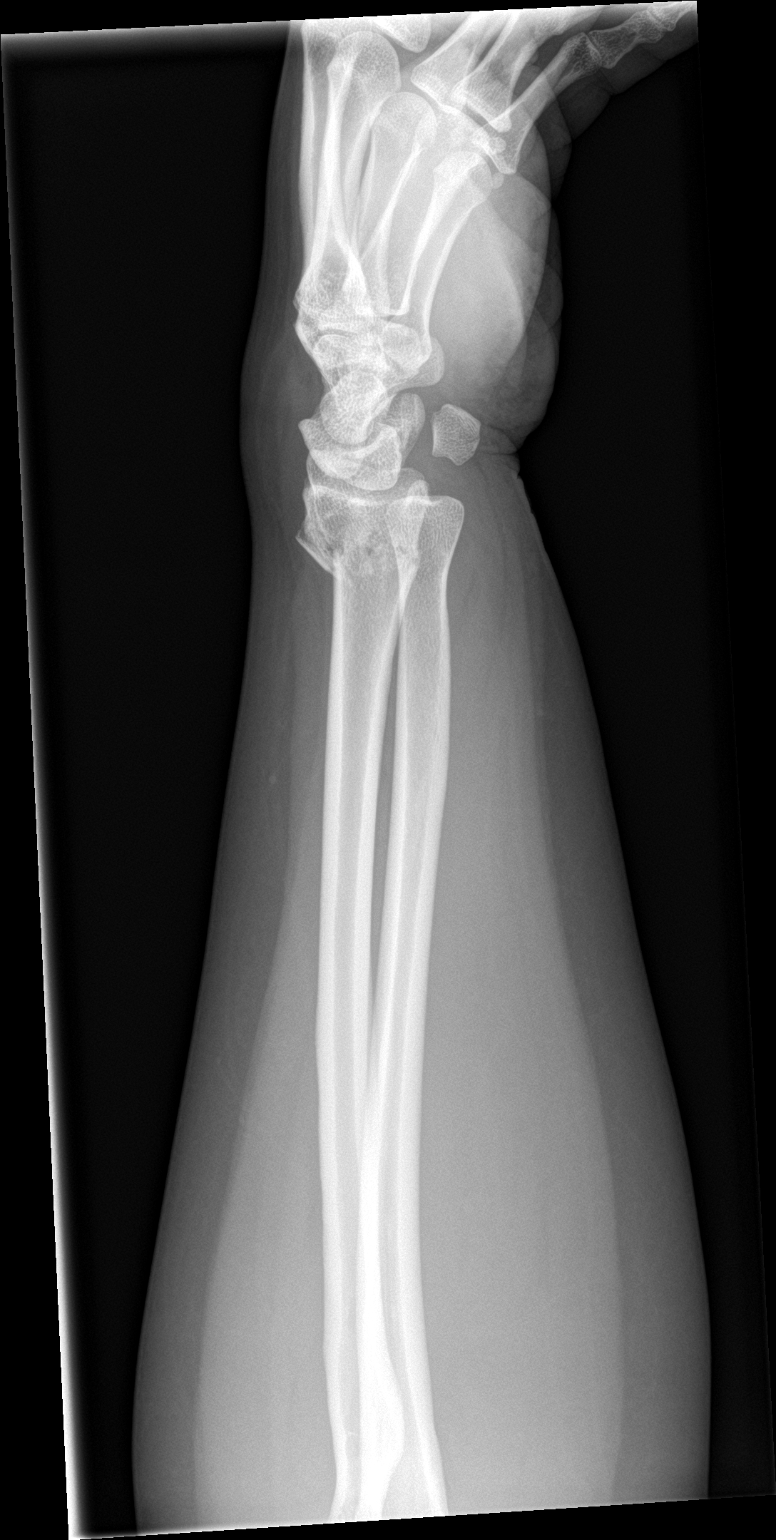

[wrist navicular]
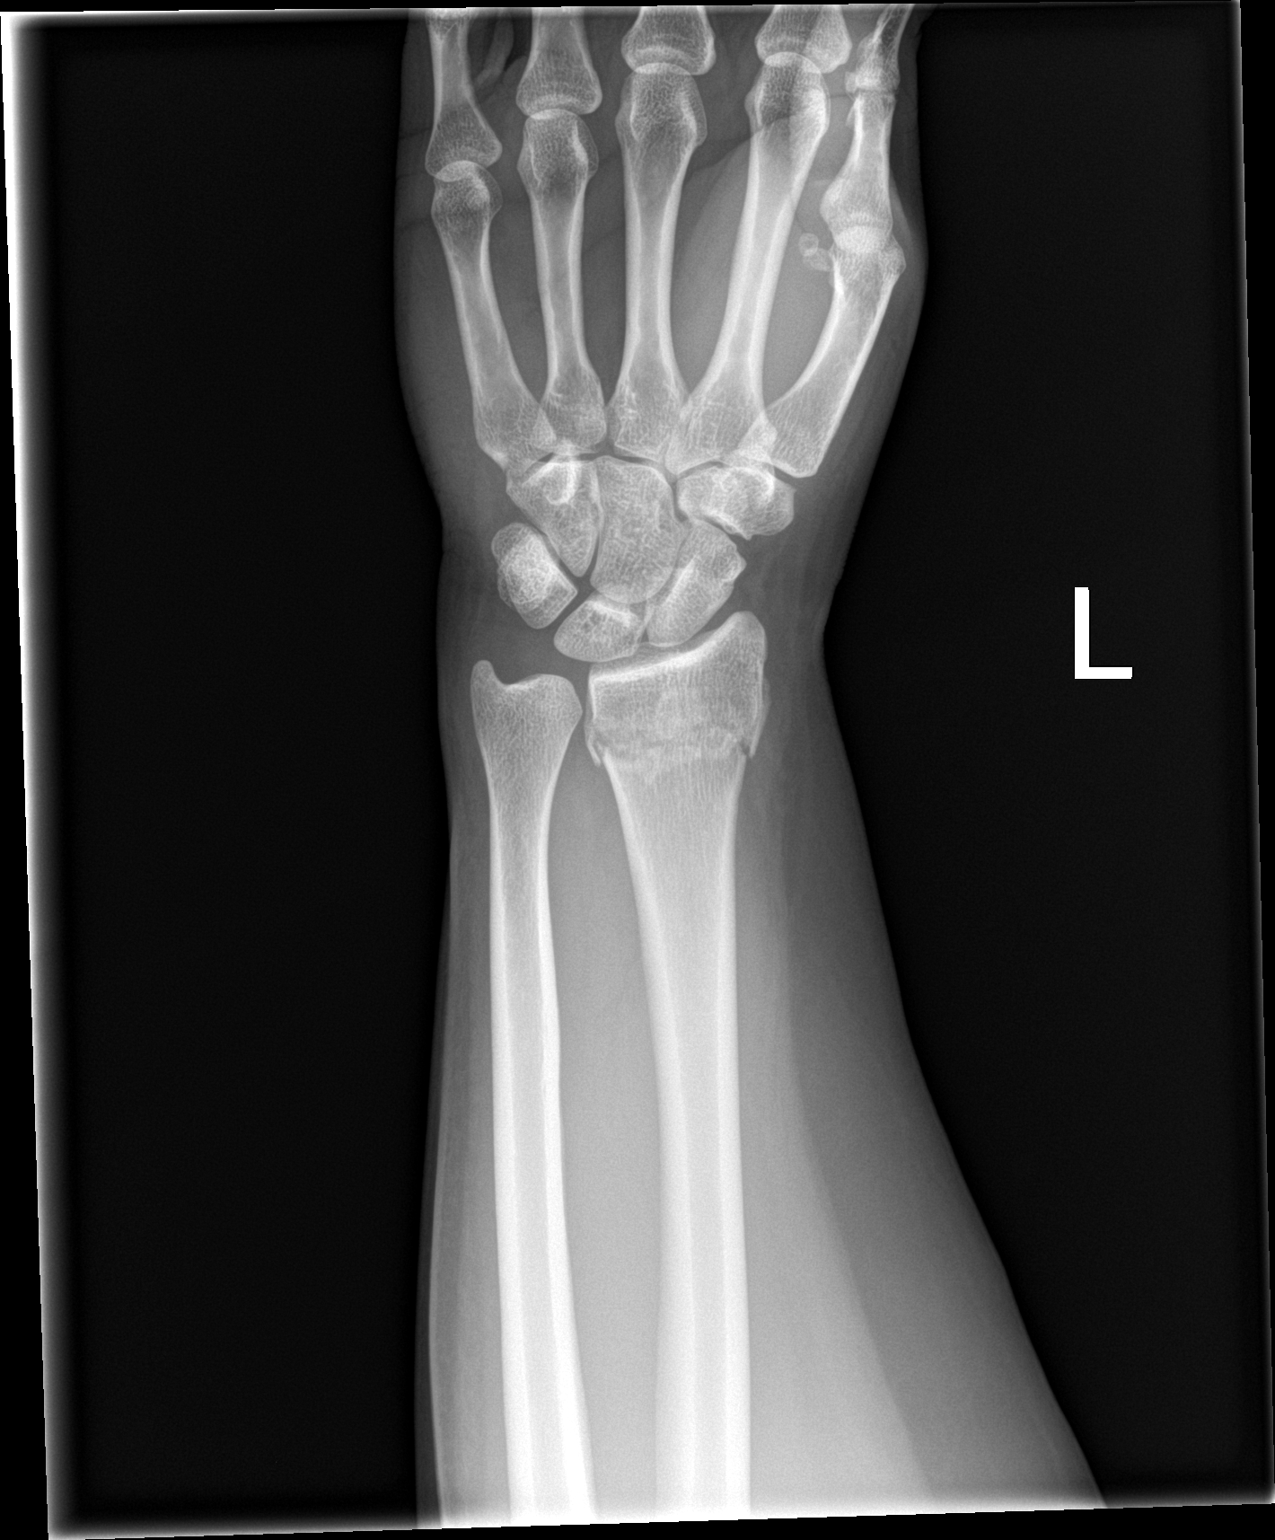

[wrist lat]
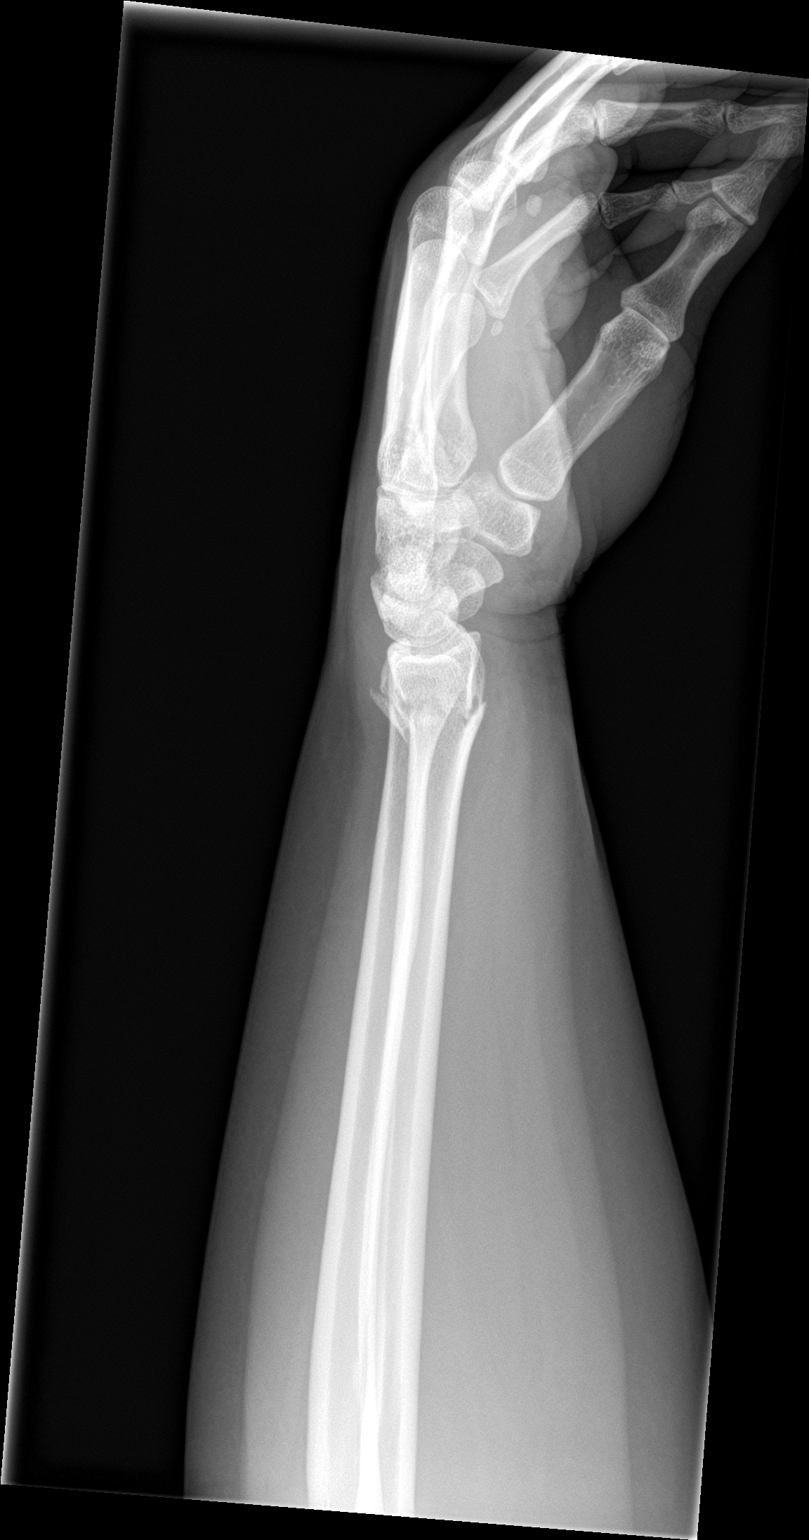

[3 of 3 positions shown; findings below may reference images not displayed]

FINDINGS: Acute comminuted and impacted distal radius fracture with less than
[DATE] shaft diameter dorsal displacement of distal fracture fragment
with minimal dorsal angulation.
IMPRESSION: Acute comminuted, mildly displaced and angulated distal radius
fracture.
